# Patient Record
Sex: Female | Born: 1955 | Race: White | Hispanic: No | Marital: Married | Smoking: Former smoker
Health system: Southern US, Community
[De-identification: ages and names within clinical notes are randomized; demographics above are authoritative.]

## PROBLEM LIST (undated history)

## (undated) DIAGNOSIS — I499 Cardiac arrhythmia, unspecified: Secondary | ICD-10-CM

## (undated) DIAGNOSIS — I10 Essential (primary) hypertension: Secondary | ICD-10-CM

## (undated) DIAGNOSIS — R011 Cardiac murmur, unspecified: Secondary | ICD-10-CM

## (undated) HISTORY — PX: CHOLECYSTECTOMY: SHX55

## (undated) HISTORY — PX: LAPAROSCOPIC HYSTERECTOMY: SHX1926

## (undated) HISTORY — PX: COLONOSCOPY: SHX5424

---

## 2004-06-04 ENCOUNTER — Ambulatory Visit: Payer: Self-pay

## 2006-08-18 ENCOUNTER — Ambulatory Visit: Payer: Self-pay | Admitting: Obstetrics and Gynecology

## 2006-09-01 ENCOUNTER — Other Ambulatory Visit: Payer: Self-pay

## 2006-09-01 ENCOUNTER — Emergency Department: Payer: Self-pay | Admitting: Emergency Medicine

## 2006-09-10 ENCOUNTER — Ambulatory Visit: Payer: Self-pay | Admitting: Oncology

## 2006-09-21 LAB — CBC WITH DIFFERENTIAL (CANCER CENTER ONLY)
BASO#: 0.1 10*3/uL (ref 0.0–0.2)
LYMPH#: 1.8 10*3/uL (ref 0.9–3.3)
MCH: 32 pg (ref 26.0–34.0)
MCV: 94 fL (ref 81–101)
MONO#: 0.7 10*3/uL (ref 0.1–0.9)
WBC: 11.6 10*3/uL — ABNORMAL HIGH (ref 3.9–10.0)

## 2006-09-21 LAB — COMPREHENSIVE METABOLIC PANEL
AST: 20 U/L (ref 0–37)
Albumin: 4 g/dL (ref 3.5–5.2)
Glucose, Bld: 91 mg/dL (ref 70–99)
Potassium: 3.8 mEq/L (ref 3.5–5.3)

## 2006-09-21 LAB — LACTATE DEHYDROGENASE: LDH: 158 U/L (ref 94–250)

## 2006-09-23 ENCOUNTER — Ambulatory Visit (HOSPITAL_COMMUNITY): Admission: RE | Admit: 2006-09-23 | Discharge: 2006-09-23 | Payer: Self-pay | Admitting: Oncology

## 2006-10-07 LAB — COMPREHENSIVE METABOLIC PANEL
ALT: 27 U/L (ref 0–35)
AST: 21 U/L (ref 0–37)
Albumin: 4.3 g/dL (ref 3.5–5.2)
Alkaline Phosphatase: 44 U/L (ref 39–117)
BUN: 12 mg/dL (ref 6–23)
Calcium: 9.4 mg/dL (ref 8.4–10.5)
Chloride: 103 mEq/L (ref 96–112)
Potassium: 3.7 mEq/L (ref 3.5–5.3)
Sodium: 140 mEq/L (ref 135–145)

## 2006-10-07 LAB — CBC WITH DIFFERENTIAL (CANCER CENTER ONLY)
BASO%: 0.6 % (ref 0.0–2.0)
EOS%: 2.4 % (ref 0.0–7.0)
HCT: 43.3 % (ref 34.8–46.6)
LYMPH#: 1.9 10*3/uL (ref 0.9–3.3)
MCV: 93 fL (ref 81–101)
MONO%: 5 % (ref 0.0–13.0)
NEUT#: 4.4 10*3/uL (ref 1.5–6.5)
NEUT%: 64.5 % (ref 39.6–80.0)
RDW: 11.6 % (ref 10.5–14.6)
WBC: 6.8 10*3/uL (ref 3.9–10.0)

## 2006-10-07 LAB — LACTATE DEHYDROGENASE: LDH: 171 U/L (ref 94–250)

## 2006-12-20 ENCOUNTER — Ambulatory Visit: Payer: Self-pay | Admitting: Oncology

## 2006-12-21 LAB — CBC WITH DIFFERENTIAL (CANCER CENTER ONLY)
HGB: 13.6 g/dL (ref 11.6–15.9)
LYMPH#: 2.1 10*3/uL (ref 0.9–3.3)
MCH: 31.5 pg (ref 26.0–34.0)
MCHC: 34.1 g/dL (ref 32.0–36.0)
MCV: 92 fL (ref 81–101)
MONO#: 0.3 10*3/uL (ref 0.1–0.9)
NEUT#: 4.3 10*3/uL (ref 1.5–6.5)
NEUT%: 61.6 % (ref 39.6–80.0)
Platelets: 226 10*3/uL (ref 145–400)
RDW: 11.6 % (ref 10.5–14.6)
WBC: 6.9 10*3/uL (ref 3.9–10.0)

## 2006-12-21 LAB — BASIC METABOLIC PANEL
BUN: 14 mg/dL (ref 6–23)
CO2: 27 mEq/L (ref 19–32)
Calcium: 9.6 mg/dL (ref 8.4–10.5)
Chloride: 102 mEq/L (ref 96–112)
Creatinine, Ser: 0.69 mg/dL (ref 0.40–1.20)
Sodium: 139 mEq/L (ref 135–145)

## 2007-06-24 ENCOUNTER — Ambulatory Visit: Payer: Self-pay | Admitting: Oncology

## 2007-08-18 ENCOUNTER — Ambulatory Visit: Payer: Self-pay | Admitting: Oncology

## 2009-01-21 ENCOUNTER — Ambulatory Visit: Payer: Self-pay

## 2010-03-18 ENCOUNTER — Ambulatory Visit: Payer: Self-pay

## 2012-06-08 ENCOUNTER — Ambulatory Visit: Payer: Self-pay | Admitting: General Practice

## 2013-03-21 DIAGNOSIS — M25559 Pain in unspecified hip: Secondary | ICD-10-CM | POA: Insufficient documentation

## 2013-04-03 DIAGNOSIS — M545 Low back pain, unspecified: Secondary | ICD-10-CM | POA: Insufficient documentation

## 2013-04-04 ENCOUNTER — Ambulatory Visit: Payer: Self-pay

## 2013-04-19 ENCOUNTER — Ambulatory Visit: Payer: Self-pay

## 2013-09-08 DIAGNOSIS — Z0000 Encounter for general adult medical examination without abnormal findings: Secondary | ICD-10-CM | POA: Insufficient documentation

## 2013-09-08 DIAGNOSIS — E7800 Pure hypercholesterolemia, unspecified: Secondary | ICD-10-CM | POA: Insufficient documentation

## 2014-01-17 DIAGNOSIS — R2230 Localized swelling, mass and lump, unspecified upper limb: Secondary | ICD-10-CM | POA: Insufficient documentation

## 2014-01-17 DIAGNOSIS — M25519 Pain in unspecified shoulder: Secondary | ICD-10-CM | POA: Insufficient documentation

## 2014-01-26 ENCOUNTER — Emergency Department: Payer: Self-pay | Admitting: Emergency Medicine

## 2014-01-26 LAB — CBC
HCT: 39.6 % (ref 35.0–47.0)
HGB: 13.3 g/dL (ref 12.0–16.0)
MCH: 31.1 pg (ref 26.0–34.0)
MCHC: 33.5 g/dL (ref 32.0–36.0)
MCV: 93 fL (ref 80–100)
PLATELETS: 221 10*3/uL (ref 150–440)
RBC: 4.26 10*6/uL (ref 3.80–5.20)
RDW: 12.5 % (ref 11.5–14.5)
WBC: 5.8 10*3/uL (ref 3.6–11.0)

## 2014-01-26 LAB — BASIC METABOLIC PANEL
ANION GAP: 4 — AB (ref 7–16)
BUN: 15 mg/dL (ref 7–18)
CALCIUM: 9.1 mg/dL (ref 8.5–10.1)
CO2: 29 mmol/L (ref 21–32)
Chloride: 103 mmol/L (ref 98–107)
Creatinine: 0.54 mg/dL — ABNORMAL LOW (ref 0.60–1.30)
EGFR (African American): >60
Glucose: 102 mg/dL — ABNORMAL HIGH (ref 65–99)
OSMOLALITY: 273 (ref 275–301)
POTASSIUM: 3.5 mmol/L (ref 3.5–5.1)
SODIUM: 136 mmol/L (ref 136–145)

## 2014-01-26 LAB — TROPONIN I

## 2014-01-26 LAB — PRO B NATRIURETIC PEPTIDE: B-Type Natriuretic Peptide: 61 pg/mL (ref 0–125)

## 2014-02-02 DIAGNOSIS — M7550 Bursitis of unspecified shoulder: Secondary | ICD-10-CM | POA: Insufficient documentation

## 2014-02-02 DIAGNOSIS — M799 Soft tissue disorder, unspecified: Secondary | ICD-10-CM | POA: Insufficient documentation

## 2014-02-02 DIAGNOSIS — M7989 Other specified soft tissue disorders: Secondary | ICD-10-CM | POA: Insufficient documentation

## 2014-04-05 ENCOUNTER — Ambulatory Visit: Payer: Self-pay | Admitting: Family Medicine

## 2014-04-11 DIAGNOSIS — Z90710 Acquired absence of both cervix and uterus: Secondary | ICD-10-CM | POA: Insufficient documentation

## 2014-09-12 DIAGNOSIS — N952 Postmenopausal atrophic vaginitis: Secondary | ICD-10-CM | POA: Insufficient documentation

## 2015-03-12 DIAGNOSIS — K219 Gastro-esophageal reflux disease without esophagitis: Secondary | ICD-10-CM | POA: Insufficient documentation

## 2015-03-12 DIAGNOSIS — N930 Postcoital and contact bleeding: Secondary | ICD-10-CM | POA: Insufficient documentation

## 2015-04-01 DIAGNOSIS — R1013 Epigastric pain: Secondary | ICD-10-CM | POA: Insufficient documentation

## 2015-06-12 DIAGNOSIS — I10 Essential (primary) hypertension: Secondary | ICD-10-CM | POA: Insufficient documentation

## 2015-06-12 DIAGNOSIS — R140 Abdominal distension (gaseous): Secondary | ICD-10-CM | POA: Insufficient documentation

## 2015-08-28 DIAGNOSIS — IMO0001 Reserved for inherently not codable concepts without codable children: Secondary | ICD-10-CM | POA: Insufficient documentation

## 2015-10-14 ENCOUNTER — Ambulatory Visit: Payer: Self-pay | Admitting: Registered Nurse

## 2015-10-14 VITALS — BP 130/80 | HR 74 | Temp 98.5°F

## 2015-10-14 DIAGNOSIS — B349 Viral infection, unspecified: Secondary | ICD-10-CM

## 2015-10-14 DIAGNOSIS — H6593 Unspecified nonsuppurative otitis media, bilateral: Secondary | ICD-10-CM

## 2015-10-14 DIAGNOSIS — R300 Dysuria: Secondary | ICD-10-CM

## 2015-10-14 DIAGNOSIS — N3090 Cystitis, unspecified without hematuria: Secondary | ICD-10-CM

## 2015-10-14 DIAGNOSIS — J0110 Acute frontal sinusitis, unspecified: Secondary | ICD-10-CM

## 2015-10-14 LAB — POCT URINALYSIS DIPSTICK
Bilirubin, UA: NEGATIVE
GLUCOSE UA: NEGATIVE
KETONES UA: NEGATIVE
Leukocytes, UA: NEGATIVE
Nitrite, UA: NEGATIVE
Protein, UA: NEGATIVE
RBC UA: NEGATIVE
SPEC GRAV UA: 1.01
Urobilinogen, UA: 0.2
pH, UA: 6

## 2015-10-14 NOTE — Progress Notes (Signed)
Subjective:    Patient ID: Kristin Macias, female    DOB: 06-22-1956, 60 y.o.   MRN: [id number]  HPI Comments: Married caucasian female here for evaluation of urinary frequency and burning since 11 Oct 2015 not improving with increased po fluids.  Denied PMHx kidney stones; had diarrhea x 7 yesterday none today didn't eat breakfast this am concerned would need fasting labs for appt; denied changes in chronic back pain; nausea x 2 days; daily headache x 6 months; exposed to others with illness at work  PMHx heartburn had to stop Rx medications due to cost prn tums now + belching and epigastric discomfort, flatulence; PMHx atrophic vaginitis stopped hormones and effexor has noticed increased frequency of vaginal bleeding after intercourse; denied new sexual partners  Urinary Tract Infection  This is a new problem. The current episode started in the past 7 days. The problem occurs every urination. The problem has been unchanged. The quality of the pain is described as burning. The pain is at a severity of 4/10. The pain is mild. There has been no fever. She is sexually active. There is no history of pyelonephritis. Associated symptoms include frequency and nausea. Pertinent negatives include no chills, discharge, flank pain, hematuria, hesitancy, possible pregnancy, sweats, urgency or vomiting. She has tried increased fluids for the symptoms. The treatment provided no relief. There is no history of catheterization, kidney stones, recurrent UTIs, a single kidney, urinary stasis or a urological procedure.      Review of Systems  Constitutional: Negative for fever, chills, diaphoresis, activity change, appetite change, fatigue and unexpected weight change.  HENT: Negative for congestion, dental problem, drooling, ear discharge, ear pain, facial swelling, hearing loss, mouth sores, nosebleeds, postnasal drip, rhinorrhea, sinus pressure, sneezing, sore throat, tinnitus, trouble swallowing and  voice change.   Eyes: Negative for photophobia, pain, discharge, redness, itching and visual disturbance.  Respiratory: Negative for cough, choking, chest tightness, shortness of breath, wheezing and stridor.   Cardiovascular: Negative for chest pain, palpitations and leg swelling.  Gastrointestinal: Positive for nausea, abdominal pain and diarrhea. Negative for vomiting, constipation, blood in stool and abdominal distention.  Endocrine: Negative for cold intolerance and heat intolerance.  Genitourinary: Positive for frequency. Negative for dysuria, hesitancy, urgency, hematuria, flank pain and difficulty urinating.  Musculoskeletal: Negative for myalgias, back pain, joint swelling, arthralgias, gait problem, neck pain and neck stiffness.  Skin: Negative for color change, pallor, rash and wound.  Allergic/Immunologic: Positive for environmental allergies. Negative for food allergies.  Neurological: Positive for headaches. Negative for dizziness, tremors, seizures, syncope, facial asymmetry, speech difficulty, weakness, light-headedness and numbness.  Hematological: Negative for adenopathy. Does not bruise/bleed easily.  Psychiatric/Behavioral: Negative for behavioral problems, confusion, sleep disturbance and agitation.       Objective:   Physical Exam  Constitutional: She is oriented to person, place, and time. She appears well-developed and well-nourished. She is active and cooperative.  Non-toxic appearance. She does not have a sickly appearance. She appears ill. No distress.  HENT:  Head: Normocephalic and atraumatic.  Right Ear: Hearing, external ear and ear canal normal. A middle ear effusion is present.  Left Ear: Hearing, external ear and ear canal normal. A middle ear effusion is present.  Nose: Mucosal edema and rhinorrhea present. No nose lacerations, sinus tenderness, nasal deformity, septal deviation or nasal septal hematoma. No epistaxis.  No foreign bodies. Right sinus exhibits  frontal sinus tenderness. Right sinus exhibits no maxillary sinus tenderness. Left sinus exhibits frontal sinus tenderness. Left sinus  exhibits no maxillary sinus tenderness.  Mouth/Throat: Uvula is midline and mucous membranes are normal. Mucous membranes are not pale, not dry and not cyanotic. She does not have dentures. No oral lesions. No trismus in the jaw. Normal dentition. No dental abscesses, uvula swelling, lacerations or dental caries. Posterior oropharyngeal edema and posterior oropharyngeal erythema present. No oropharyngeal exudate or tonsillar abscesses.  Cobblestoning posterior pharynx; bilateral TMs with air fluid level clear; bilateral nasal turbinates with edema/erythema/clear discharge  Eyes: Conjunctivae, EOM and lids are normal. Pupils are equal, round, and reactive to light. Right eye exhibits no chemosis, no discharge, no exudate and no hordeolum. No foreign body present in the right eye. Left eye exhibits no chemosis, no discharge, no exudate and no hordeolum. No foreign body present in the left eye. Right conjunctiva is not injected. Right conjunctiva has no hemorrhage. Left conjunctiva is not injected. Left conjunctiva has no hemorrhage. No scleral icterus. Right eye exhibits normal extraocular motion and no nystagmus. Left eye exhibits normal extraocular motion and no nystagmus. Right pupil is round and reactive. Left pupil is round and reactive. Pupils are equal.  Neck: Trachea normal and normal range of motion. Neck supple. No tracheal tenderness, no spinous process tenderness and no muscular tenderness present. No rigidity. No tracheal deviation, no edema, no erythema and normal range of motion present. No thyroid mass and no thyromegaly present.  Cardiovascular: Normal rate, regular rhythm, S1 normal, S2 normal, normal heart sounds and intact distal pulses.  PMI is not displaced.  Exam reveals no gallop and no friction rub.   No murmur heard. Pulmonary/Chest: Effort normal and  breath sounds normal. No accessory muscle usage or stridor. No respiratory distress. She has no decreased breath sounds. She has no wheezes. She has no rhonchi. She has no rales. She exhibits no tenderness.  Abdominal: Soft. Normal appearance. She exhibits no shifting dullness, no distension, no pulsatile liver, no fluid wave, no abdominal bruit, no ascites, no pulsatile midline mass and no mass. Bowel sounds are decreased. There is no hepatosplenomegaly. There is tenderness in the epigastric area and suprapubic area. There is no rigidity, no rebound, no guarding, no CVA tenderness, no tenderness at McBurney's point and negative Murphy's sign.    tympanny to percussion x 4 quads; hypoactive bowel sounds x 4 quads  Musculoskeletal: Normal range of motion. She exhibits no edema or tenderness.       Right shoulder: Normal.       Left shoulder: Normal.       Right hip: Normal.       Left hip: Normal.       Right knee: Normal.       Left knee: Normal.       Cervical back: Normal.       Right hand: Normal.       Left hand: Normal.  Lymphadenopathy:       Head (right side): No submental, no submandibular, no tonsillar, no preauricular, no posterior auricular and no occipital adenopathy present.       Head (left side): No submental, no submandibular, no tonsillar, no preauricular, no posterior auricular and no occipital adenopathy present.    She has no cervical adenopathy.       Right cervical: No superficial cervical, no deep cervical and no posterior cervical adenopathy present.      Left cervical: No superficial cervical, no deep cervical and no posterior cervical adenopathy present.  Neurological: She is alert and oriented to person, place, and time. She  has normal strength. She is not disoriented. She displays no atrophy and no tremor. No cranial nerve deficit or sensory deficit. She exhibits normal muscle tone. She displays no seizure activity. Coordination and gait normal. GCS eye subscore is 4.  GCS verbal subscore is 5. GCS motor subscore is 6.  Skin: Skin is warm, dry and intact. No abrasion, no bruising, no burn, no ecchymosis, no laceration, no lesion, no petechiae and no rash noted. She is not diaphoretic. No cyanosis or erythema. No pallor. Nails show no clubbing.  Psychiatric: She has a normal mood and affect. Her speech is normal and behavior is normal. Judgment and thought content normal. Cognition and memory are normal.  Nursing note and vitals reviewed.    1115 discussed urinalysis results with patient normal negative blood, ketones, bacteria, protein patient verbalized understanding of information and had no further questions at this time.     Assessment & Plan:  A-cystitis acute, viral illness; bilateral otitis media effusion; sinusitis acute frontal. P-Irritation may be due to frequent wiping and mild dehydration due to diarrhea over weekend.  Also with atrophic vaginitis.  Discussed with patient to schedule GYN appt as worsening of symptoms for re-evaluation and consider other treatments than topical hormones and effexor which she dc'd.  Patient is also to push fluids and may use Pyridium otc prn x 2 days as needed.  Patient refused Rx for pyridium  Hydrate, avoid dehydration.  Avoid holding urine void on frequent basis every 4 to 6 hours.  If unable to void every 8 hours follow up for re-evaluation with PCM, urgent care or ER.   Call or return to clinic as needed if these symptoms worsen or fail to improve as anticipated.  Patient verbalized agreement and understanding of treatment plan and had no further questions at this time. P2:  Hydrate and cranberry juice  Recommended to patient stay at home if diarrhea until resolved x 24 hours.  Heartburn prn tums OTC 1-2 q6h or zantac 150mg  po BID.  Refused work note  I have recommended clear fluids and bland diet.  Avoid dairy/spicy, fried and large portions of meat while having nausea.  If vomiting hold po intake x 1 hour.  Then  sips clear fluids like broths, ginger ale, power ade, gatorade, pedialyte may advance to soft/bland if no vomiting x 24 hours and appetite returned otherwise hydration main focus.     Return to the clinic if symptoms persist or worsen; I have alerted the patient to call if high fever, dehydration, marked weakness, fainting, increased abdominal pain, blood in stool or vomit (red or black).   Patient verbalized agreement and understanding of treatment plan and had no further questions at this time.  Supportive treatment.   No evidence of invasive bacterial infection, non toxic and well hydrated.  This is most likely self limiting viral infection.  I do not see where any further testing or imaging is necessary at this time.   I will suggest supportive care, rest, good hygiene and encourage the patient to take adequate fluids.  The patient is to return to clinic or EMERGENCY ROOM if symptoms worsen or change significantly e.g. ear pain, fever, purulent discharge from ears or bleeding.  Patient verbalized agreement and understanding of treatment plan.      Trial flonase 1 spray each nostril BID, nasal saline 2 sprays each nostril q2h prn congestion.  Consider antihistamine OTC po daily.  No evidence of systemic bacterial infection, non toxic and well hydrated.  I do not see where any further testing or imaging is necessary at this time.   I will suggest supportive care, rest, good hygiene and encourage the patient to take adequate fluids.  The patient is to return to clinic or EMERGENCY ROOM if symptoms worsen or change significantly.  Patient verbalized agreement and understanding of treatment plan and had no further questions at this time.   P2:  Hand washing and cover cough

## 2016-01-20 ENCOUNTER — Encounter: Payer: Self-pay | Admitting: Physician Assistant

## 2016-01-20 ENCOUNTER — Ambulatory Visit: Payer: Self-pay | Admitting: Physician Assistant

## 2016-01-20 VITALS — BP 100/70 | HR 69 | Temp 99.1°F

## 2016-01-20 DIAGNOSIS — R509 Fever, unspecified: Secondary | ICD-10-CM

## 2016-01-20 MED ORDER — DOXYCYCLINE HYCLATE 100 MG PO TABS: 100.0000 mg | ORAL_TABLET | Freq: Two times a day (BID) | ORAL | Status: DC

## 2016-01-20 NOTE — Progress Notes (Signed)
S: C/o runny nose, sore throat, headache, and  congestion for 3 days, + fever, chills, no cp/sob, v/d; mucus is clear throughout the day, cough is sporadic, ?tick bite, has been outside quite a bit but hasn't seen a tick  Using otc meds: robitussin  O: PE: vitals wnl, nperrl eomi, normocephalic, tms dull, nasal mucosa red and swollen, throat wnl, , neck supple no lymph, lungs c t a, cv rrr, neuro intact  A:  Acute viral uri, fever with ?tick exposure   P: drink fluids, continue regular meds , use otc meds of choice, return if not improving in 5 days, return earlier if worsening , doxy 100mg  bid

## 2016-05-29 ENCOUNTER — Ambulatory Visit: Payer: Self-pay | Admitting: Physician Assistant

## 2016-05-29 ENCOUNTER — Encounter: Payer: Self-pay | Admitting: Physician Assistant

## 2016-05-29 VITALS — BP 126/60 | HR 66 | Temp 98.5°F

## 2016-05-29 DIAGNOSIS — R42 Dizziness and giddiness: Secondary | ICD-10-CM

## 2016-05-29 MED ORDER — MECLIZINE HCL 25 MG PO TABS: 25.0000 mg | ORAL_TABLET | Freq: Three times a day (TID) | ORAL | 0 refills | Status: DC | PRN

## 2016-05-29 NOTE — Progress Notes (Signed)
S: c/o dizziness with head movement, started this morning when she got out of bed, no cp/sob, no v/d, hx of same over a year ago  O: vitals wnl, nad, perrl eomi, pt states eomi make her feel dizzy, no nystagmus noted, tms clear, nasal mucosa boggy, throat wnl , neck supple no lymph, lungs c t a, cv rrr, no bruits at carotids  A: acute vertigo  P: antivert 25mg  tid

## 2017-01-29 ENCOUNTER — Encounter: Payer: Self-pay | Admitting: Physician Assistant

## 2017-01-29 ENCOUNTER — Ambulatory Visit: Payer: Self-pay | Admitting: Physician Assistant

## 2017-01-29 VITALS — BP 121/60 | HR 70 | Temp 98.6°F

## 2017-01-29 DIAGNOSIS — W57XXXA Bitten or stung by nonvenomous insect and other nonvenomous arthropods, initial encounter: Secondary | ICD-10-CM

## 2017-01-29 MED ORDER — METHYLPREDNISOLONE 4 MG PO TBPK: ORAL_TABLET | ORAL | 0 refills | Status: DC

## 2017-01-29 MED ORDER — SULFAMETHOXAZOLE-TRIMETHOPRIM 800-160 MG PO TABS: 1.0000 | ORAL_TABLET | Freq: Two times a day (BID) | ORAL | 0 refills | Status: DC

## 2017-01-29 MED ORDER — DEXAMETHASONE SODIUM PHOSPHATE 10 MG/ML IJ SOLN
10.0000 mg | Freq: Once | INTRAMUSCULAR | Status: AC
  Administered 2017-01-29: 10 mg via INTRAMUSCULAR

## 2017-01-29 NOTE — Progress Notes (Signed)
S: c/o red swollen area on her stomach area, was on a float in her pool last night, when she got up she felt something sting her, put etoh on it, this morning when she got up the entire area is red and warm, denies fever/chills, no drainage from the site  O: vitals wnl, nad, skin with large area on abd that is pink warm and a little tender, no drainage noted, no visible bite marks, n/v intact  A: insect bite  P: decadron 10mg  IM, medrol dose pack, septra

## 2017-02-03 DIAGNOSIS — F1029 Alcohol dependence with unspecified alcohol-induced disorder: Secondary | ICD-10-CM | POA: Insufficient documentation

## 2017-04-02 ENCOUNTER — Ambulatory Visit: Payer: Self-pay | Admitting: Physician Assistant

## 2017-04-02 ENCOUNTER — Encounter: Payer: Self-pay | Admitting: Physician Assistant

## 2017-04-02 VITALS — BP 124/66 | HR 69 | Temp 98.9°F | Resp 16

## 2017-04-02 DIAGNOSIS — R5383 Other fatigue: Secondary | ICD-10-CM

## 2017-04-02 NOTE — Progress Notes (Signed)
S: states she is really tired and fatigued all of the time, isn't taking her antidepressant anymore, states she is controlling her drinking but is not taking naltrexone; having more hot flashes at night since coming off of hormone pills, denies cp/sob/v/d/fever/chills  O: vitals wnl, nad, lungs c t a, cv rrr, abd soft nontender bs normal, n/v intact  A: fatigue  P: labs, f/u with pcp

## 2017-04-05 LAB — CMP12+LP+TP+TSH+6AC+CBC/D/PLT
ALBUMIN: 4.3 g/dL (ref 3.6–4.8)
ALT: 14 IU/L (ref 0–32)
AST: 18 IU/L (ref 0–40)
Albumin/Globulin Ratio: 2 (ref 1.2–2.2)
Alkaline Phosphatase: 58 IU/L (ref 39–117)
BUN/Creatinine Ratio: 29 — ABNORMAL HIGH (ref 12–28)
BUN: 20 mg/dL (ref 8–27)
Basophils Absolute: 0 10*3/uL (ref 0.0–0.2)
Basos: 0 %
Bilirubin Total: 0.4 mg/dL (ref 0.0–1.2)
CHLORIDE: 103 mmol/L (ref 96–106)
CHOLESTEROL TOTAL: 223 mg/dL — AB (ref 100–199)
CREATININE: 0.7 mg/dL (ref 0.57–1.00)
Calcium: 9.6 mg/dL (ref 8.7–10.3)
Chol/HDL Ratio: 2.9 ratio (ref 0.0–4.4)
EOS (ABSOLUTE): 0.2 10*3/uL (ref 0.0–0.4)
EOS: 3 %
Free Thyroxine Index: 2.2 (ref 1.2–4.9)
GFR calc Af Amer: 109 mL/min/{1.73_m2} (ref >59)
GFR, EST NON AFRICAN AMERICAN: 94 mL/min/{1.73_m2} (ref >59)
GGT: 18 IU/L (ref 0–60)
GLUCOSE: 96 mg/dL (ref 65–99)
Globulin, Total: 2.1 g/dL (ref 1.5–4.5)
HDL: 78 mg/dL (ref >39)
HEMOGLOBIN: 12.8 g/dL (ref 11.1–15.9)
Hematocrit: 38 % (ref 34.0–46.6)
IMMATURE GRANS (ABS): 0 10*3/uL (ref 0.0–0.1)
IRON: 110 ug/dL (ref 27–159)
Immature Granulocytes: 0 %
LDH: 210 IU/L (ref 119–226)
LDL Calculated: 129 mg/dL — ABNORMAL HIGH (ref 0–99)
LYMPHS ABS: 2 10*3/uL (ref 0.7–3.1)
Lymphs: 37 %
MCH: 30.5 pg (ref 26.6–33.0)
MCHC: 33.7 g/dL (ref 31.5–35.7)
MCV: 91 fL (ref 79–97)
MONOS ABS: 0.4 10*3/uL (ref 0.1–0.9)
Monocytes: 7 %
Neutrophils Absolute: 2.8 10*3/uL (ref 1.4–7.0)
Neutrophils: 53 %
PHOSPHORUS: 3.8 mg/dL (ref 2.5–4.5)
PLATELETS: 248 10*3/uL (ref 150–379)
POTASSIUM: 4.5 mmol/L (ref 3.5–5.2)
RBC: 4.2 x10E6/uL (ref 3.77–5.28)
RDW: 13.3 % (ref 12.3–15.4)
SODIUM: 143 mmol/L (ref 134–144)
T3 UPTAKE RATIO: 32 % (ref 24–39)
T4 TOTAL: 6.8 ug/dL (ref 4.5–12.0)
TOTAL PROTEIN: 6.4 g/dL (ref 6.0–8.5)
TSH: 1.43 u[IU]/mL (ref 0.450–4.500)
Triglycerides: 81 mg/dL (ref 0–149)
URIC ACID: 5 mg/dL (ref 2.5–7.1)
VLDL Cholesterol Cal: 16 mg/dL (ref 5–40)
WBC: 5.4 10*3/uL (ref 3.4–10.8)

## 2017-04-05 LAB — EPSTEIN-BARR VIRUS VCA ANTIBODY PANEL
EBV Early Antigen Ab, IgG: <9 U/mL (ref 0.0–8.9)
EBV NA IGG: 155 U/mL — AB (ref 0.0–17.9)
EBV VCA IgG: >600 U/mL — ABNORMAL HIGH (ref 0.0–17.9)
EBV VCA IgM: 79.9 U/mL — ABNORMAL HIGH (ref 0.0–35.9)

## 2017-04-05 LAB — VITAMIN D 25 HYDROXY (VIT D DEFICIENCY, FRACTURES): VIT D 25 HYDROXY: 43.5 ng/mL (ref 30.0–100.0)

## 2017-04-05 LAB — B12 AND FOLATE PANEL
FOLATE: 16.5 ng/mL (ref >3.0)
VITAMIN B 12: 1887 pg/mL — AB (ref 232–1245)

## 2017-05-17 ENCOUNTER — Encounter: Payer: Self-pay | Admitting: Physician Assistant

## 2017-05-17 ENCOUNTER — Ambulatory Visit: Payer: Self-pay | Admitting: Physician Assistant

## 2017-05-17 VITALS — BP 140/65 | HR 69 | Temp 99.0°F | Resp 16

## 2017-05-17 DIAGNOSIS — W57XXXA Bitten or stung by nonvenomous insect and other nonvenomous arthropods, initial encounter: Secondary | ICD-10-CM

## 2017-05-17 MED ORDER — DEXAMETHASONE SODIUM PHOSPHATE 10 MG/ML IJ SOLN
10.0000 mg | Freq: Once | INTRAMUSCULAR | Status: AC
  Administered 2017-05-17: 10 mg via INTRAMUSCULAR

## 2017-05-17 MED ORDER — METHYLPREDNISOLONE 4 MG PO TBPK
ORAL_TABLET | ORAL | 0 refills | Status: DC
Start: 2017-05-17 — End: 2017-07-07

## 2017-05-17 MED ORDER — CEPHALEXIN 500 MG PO CAPS: 500.0000 mg | ORAL_CAPSULE | Freq: Three times a day (TID) | ORAL | 0 refills | Status: DC

## 2017-05-17 NOTE — Progress Notes (Signed)
S: states she went to work in the yard, put her gloves on and felt a sting on her hand, didn't see the bug, but now there is a red dot and her fingers and hand are swollen, has a red streak going up her arm, barely got her rings off of her fingers, no cp/sob, no swelling of mouth or lips, used ice, sx for 2 days  O: vitals wnl nad, skin with swollen red left hand and fingers, streak on volar aspect goes to mid forearm, pt is able to move fingers and wrist without difficulty, n/v intact, lungs c t a, cv rrr  A: insect bite, allergic reaction /cellulitis  P: keflex  tid x 7d, decadron  im given in clinic, medrol dose pack to be started tomorrow, elevated and ice, pt to return if worsening or not better in 2 days

## 2017-07-07 ENCOUNTER — Ambulatory Visit: Payer: Self-pay | Admitting: Emergency Medicine

## 2017-07-07 VITALS — BP 130/70 | HR 81 | Temp 98.5°F | Resp 16

## 2017-07-07 DIAGNOSIS — K2950 Unspecified chronic gastritis without bleeding: Secondary | ICD-10-CM

## 2017-07-07 MED ORDER — PANTOPRAZOLE SODIUM 20 MG PO TBEC: 20.0000 mg | DELAYED_RELEASE_TABLET | Freq: Every day | ORAL | 1 refills | Status: DC

## 2017-07-07 NOTE — Progress Notes (Signed)
S:  Is here with complaint of generalized  focused mainly on the right and left upper quadrants. Patient states that she has been taking Prilosec for approximately one year for gastritis and some reflux disease. Her  reflux got worse the day after Thanksgiving.  She had her gallbladder removed several years ago. She denies any shortness or chest pain.  She denies any episodes of diaphoresis or radiation of her abdominal pain. O:  Lungs are clear bilaterally. Heart regular rate and rhythm without murmur.  Abdomen soft, nontender. Bowel sounds normoactive 4 quadrants.   A:  Gastritis with reflux P:  Patient was placed on protonix  20 mg once a day for 30 days with one refill.  If not improving will consider GI

## 2017-07-26 ENCOUNTER — Ambulatory Visit: Payer: Self-pay | Admitting: Emergency Medicine

## 2017-07-26 VITALS — BP 130/79 | HR 70 | Temp 98.5°F | Resp 16

## 2017-07-26 DIAGNOSIS — K2950 Unspecified chronic gastritis without bleeding: Secondary | ICD-10-CM

## 2017-07-26 NOTE — Progress Notes (Signed)
S: Is here today with continued abdominal discomfort.  Patient states that despite watching what she is eating she continues to have reflux and gastritis.  She has continued to take Protonix 20 mg daily and is noticed only minimal improvement.  She denies any fever or chills.  No change in bowel habits.  She is status post cholecystectomy O: Abdomen soft no point tenderness noted.  Bowel sounds normoactive x4 quadrants. A: Persistent gastritis P: Patient will continue taking Protonix.  An appointment with the gastroenterologist will be made for further evaluation.

## 2017-07-28 NOTE — Addendum Note (Signed)
Addended by: Catha BrowEACON, Mandeep Kiser T on: 07/28/2017 03:10 PM   Modules accepted: Orders

## 2017-08-18 ENCOUNTER — Other Ambulatory Visit: Payer: Self-pay

## 2017-08-18 ENCOUNTER — Encounter: Payer: Self-pay | Admitting: Gastroenterology

## 2017-08-18 ENCOUNTER — Encounter (INDEPENDENT_AMBULATORY_CARE_PROVIDER_SITE_OTHER): Payer: Self-pay

## 2017-08-18 ENCOUNTER — Ambulatory Visit (INDEPENDENT_AMBULATORY_CARE_PROVIDER_SITE_OTHER): Payer: Managed Care, Other (non HMO) | Admitting: Gastroenterology

## 2017-08-18 VITALS — BP 154/81 | HR 76 | Temp 98.3°F | Ht 64.0 in | Wt 143.0 lb

## 2017-08-18 DIAGNOSIS — R1013 Epigastric pain: Secondary | ICD-10-CM | POA: Diagnosis not present

## 2017-08-18 DIAGNOSIS — Z1211 Encounter for screening for malignant neoplasm of colon: Secondary | ICD-10-CM | POA: Diagnosis not present

## 2017-08-18 NOTE — Addendum Note (Signed)
Addended by: Ardyth ManARTER, Kaydee Magel Z on: 08/18/2017 03:26 PM   Modules accepted: Orders, SmartSet

## 2017-08-18 NOTE — Progress Notes (Signed)
Kristin Mood MD, MRCP(U.K) [address]  [address]  [place], [place] [address]  Main: [phone]  Fax: [phone]   Gastroenterology Consultation  Referring Provider:     No ref. provider found Primary Care Physician:  Kristin Plunk, MD Primary Gastroenterologist:  Dr. Wyline Macias  Reason for Consultation:     Abdominal pain         HPI:   Kristin Macias is a 62 y.o. y/o female referred for consultation & management  by Dr. Ralene Macias, Kristin Basque, MD.    She has been referred for gastritis. Was commenced on Protonix on 07/26/17 and referred to GI .    Abdominal pain: Onset: She says that she has issues with Reflux for about a year. Has had pain on and off for a year, recently worse. Occurs once every other day , Each episode can last all day . Does wake her up at night  Site :Center of the abdomen  Radiation: sometimes in her throat with burning  Severity ::hurts Nature of pain: squeeze Aggravating factors: unsure  Relieving factors :goes off on its own  Weight loss: no  NSAID use: none  PPI use :yes - Pantoprazole 20 mg- helped initially but stopped. Takes it in the morning with some tea, is also on omeprazole 40 mg  Gall bladder surgery: taken out  Frequency of bowel movements: every day , not hard  Change in bowel movements: no  Relief with bowel movements: it has , thinks pain may be a bit worse when she has hard stools  Gas/Bloating/Abdominal distension: yes   EGD in 2016 - Salmon colored mucosa was seen  , colonoscopy was last in 2009 . She has had polyps  Consumes a lot of artificial sugars- 2 packs of artificial sugars a day , lot of diet soda and unsweetened tea no artificial sugars.   Prior to Admission medications   Medication Sig Start Date End Date Taking? Authorizing Provider  aspirin 81 MG chewable tablet Chew by mouth. 04/06/11   [provider]  cephALEXin (KEFLEX) 500 MG capsule Take 500 mg by mouth 3 (three) times  daily. 05/17/17   [provider]  gabapentin (NEURONTIN) 300 MG capsule Take 300 mg by mouth at bedtime. 06/24/17   [provider]  hydrochlorothiazide (HYDRODIURIL) 25 MG tablet Take 25 mg by mouth daily. 09/16/15   [provider]  ibuprofen (ADVIL,MOTRIN) 200 MG tablet Take by mouth.    [provider]  lisinopril (PRINIVIL,ZESTRIL) 20 MG tablet Take 20 mg by mouth. 09/17/15   [provider]  naltrexone (DEPADE) 50 MG tablet Take 50 mg by mouth daily. 03/16/17   [provider]  omeprazole (PRILOSEC) 40 MG capsule Take 40 mg by mouth. 05/27/17 05/27/18  [provider]  pantoprazole (PROTONIX) 20 MG tablet Take 1 tablet (20 mg total) by mouth daily. 07/07/17   Kristin Rumps, PA-C  sertraline (ZOLOFT) 50 MG tablet Take 50 mg by mouth daily. 06/24/17   [provider]  traZODone (DESYREL) 50 MG tablet Take 50 mg by mouth at bedtime. 06/24/17   [provider]  vitamin B-12 (CYANOCOBALAMIN) 1000 MCG tablet Take 1 tablet by mouth daily.    [provider]  vitamin E 1000 UNIT capsule Take by mouth.    [provider]    No family history on file.   Social History   Tobacco Use  . Smoking status: Former Games developer  . Smokeless tobacco: Never Used  Substance Use Topics  . Alcohol use: Not on file  . Drug use: Not on file    Allergies as of 08/18/2017 - Review Complete 07/26/2017  Allergen Reaction Noted  . Amlodipine  10/14/2015  . Tetracycline  10/14/2015    Review of Systems:    All systems reviewed and negative except where noted in HPI.   Physical Exam:  There were no vitals taken for this visit. No LMP recorded. Psych:  Alert and cooperative. Normal Macias and affect. General:   Alert,  Well-developed, well-nourished, pleasant and cooperative in NAD Head:  Normocephalic and atraumatic. Eyes:  Sclera clear, no icterus.   Conjunctiva pink. Ears:  Normal auditory acuity. Nose:  No  deformity, discharge, or lesions. Mouth:  No deformity or lesions,oropharynx pink & moist. Neck:  Supple; no masses or thyromegaly. Lungs:  Respirations even and unlabored.  Clear throughout to auscultation.   No wheezes, crackles, or rhonchi. No acute distress. Heart:  Regular rate and rhythm; no murmurs, clicks, rubs, or gallops. Abdomen:  Normal bowel sounds.  No bruits.  Soft, non-tender and non-distended without masses, hepatosplenomegaly or hernias noted.  No guarding or rebound tenderness.    Extremities:  No clubbing or edema.  No cyanosis. Neurologic:  Alert and oriented x3;  grossly normal neurologically. Skin:  Intact without significant lesions or rashes. No jaundice. Lymph Nodes:  No significant cervical adenopathy. Psych:  Alert and cooperative. Normal Macias and affect.  Imaging Studies: No results found.  Assessment and Plan:   Kristin Macias is a 62 y.o. y/o female has been referred for abdominal pain/dyspepsia. May have an element of pain from gas due to artificial sugars/constipation.   1. EGD to evaluate dyspepsia not responding to PPI 2. Screening colonoscopy  3. Stool pylori antigen  4. At next visit if not feeling any better will consider CT scan of the abdomen  5. Stop al artificial sugars for 2 weeks, can use regular sugar. If issues with gas feel better then can restart as tolerated.  6. Miralax PRN for constipation   I have discussed alternative options, risks & benefits,  which include, but are not limited to, bleeding, infection, perforation,respiratory complication & drug reaction.  The patient agrees with this plan & written consent will be obtained.     Follow up in 8 weeks   Dr Kristin MoodKiran Damita Eppard MD,MRCP(U.K)

## 2017-08-18 NOTE — Patient Instructions (Signed)
Mrs. Kristin Macias, Thank you for allowing us to help care for your medical needs. During today's visit Dr. Tobi BastosAnna has advised the following:  1. EGD to evaluate dyspepsia not responding to PPI 2. Screening colonoscopy  3. Stool pylori antigen  4. At next visit if not feeling any better will consider CT scan of the abdomen  5. Stop al artificial sugars for 2 weeks, can use regular sugar. If issues with gas feel better then can restart as tolerated.  6. Miralax PRN for constipation    Please stop by the desk to schedule your follow up appt.

## 2017-08-23 ENCOUNTER — Other Ambulatory Visit
Admission: RE | Admit: 2017-08-23 | Discharge: 2017-08-23 | Disposition: A | Discharge status: Home / Self Care | Payer: Managed Care, Other (non HMO) | Source: Ambulatory Visit | Attending: Gastroenterology | Admitting: Gastroenterology

## 2017-08-23 DIAGNOSIS — R1013 Epigastric pain: Secondary | ICD-10-CM | POA: Diagnosis not present

## 2017-08-25 ENCOUNTER — Ambulatory Visit: Payer: Managed Care, Other (non HMO) | Admitting: Anesthesiology

## 2017-08-25 ENCOUNTER — Encounter: Payer: Self-pay | Admitting: Gastroenterology

## 2017-08-25 ENCOUNTER — Encounter
Admission: RE | Disposition: A | Discharge status: Home / Self Care | Payer: Self-pay | Source: Ambulatory Visit | Attending: Gastroenterology

## 2017-08-25 ENCOUNTER — Ambulatory Visit
Admission: RE | Admit: 2017-08-25 | Discharge: 2017-08-25 | Disposition: A | Discharge status: Home / Self Care | Payer: Managed Care, Other (non HMO) | Source: Ambulatory Visit | Attending: Gastroenterology | Admitting: Gastroenterology

## 2017-08-25 ENCOUNTER — Encounter: Payer: Self-pay | Admitting: *Deleted

## 2017-08-25 DIAGNOSIS — Z881 Allergy status to other antibiotic agents status: Secondary | ICD-10-CM | POA: Diagnosis not present

## 2017-08-25 DIAGNOSIS — Z8719 Personal history of other diseases of the digestive system: Secondary | ICD-10-CM | POA: Insufficient documentation

## 2017-08-25 DIAGNOSIS — I10 Essential (primary) hypertension: Secondary | ICD-10-CM | POA: Diagnosis not present

## 2017-08-25 DIAGNOSIS — Z09 Encounter for follow-up examination after completed treatment for conditions other than malignant neoplasm: Secondary | ICD-10-CM | POA: Insufficient documentation

## 2017-08-25 DIAGNOSIS — Z79899 Other long term (current) drug therapy: Secondary | ICD-10-CM | POA: Diagnosis not present

## 2017-08-25 DIAGNOSIS — Z87891 Personal history of nicotine dependence: Secondary | ICD-10-CM | POA: Insufficient documentation

## 2017-08-25 DIAGNOSIS — Z888 Allergy status to other drugs, medicaments and biological substances status: Secondary | ICD-10-CM | POA: Diagnosis not present

## 2017-08-25 DIAGNOSIS — R1013 Epigastric pain: Secondary | ICD-10-CM | POA: Diagnosis not present

## 2017-08-25 DIAGNOSIS — K5730 Diverticulosis of large intestine without perforation or abscess without bleeding: Secondary | ICD-10-CM | POA: Diagnosis not present

## 2017-08-25 DIAGNOSIS — K219 Gastro-esophageal reflux disease without esophagitis: Secondary | ICD-10-CM | POA: Diagnosis not present

## 2017-08-25 DIAGNOSIS — Z7982 Long term (current) use of aspirin: Secondary | ICD-10-CM | POA: Insufficient documentation

## 2017-08-25 DIAGNOSIS — Z86010 Personal history of colonic polyps: Secondary | ICD-10-CM | POA: Diagnosis not present

## 2017-08-25 DIAGNOSIS — Z1211 Encounter for screening for malignant neoplasm of colon: Secondary | ICD-10-CM

## 2017-08-25 HISTORY — DX: Cardiac murmur, unspecified: R01.1

## 2017-08-25 HISTORY — DX: Cardiac arrhythmia, unspecified: I49.9

## 2017-08-25 HISTORY — PX: ESOPHAGOGASTRODUODENOSCOPY (EGD) WITH PROPOFOL: SHX5813

## 2017-08-25 HISTORY — DX: Essential (primary) hypertension: I10

## 2017-08-25 HISTORY — PX: COLONOSCOPY WITH PROPOFOL: SHX5780

## 2017-08-25 LAB — H. PYLORI ANTIGEN, STOOL: H. PYLORI STOOL AG, EIA: NEGATIVE

## 2017-08-25 SURGERY — ESOPHAGOGASTRODUODENOSCOPY (EGD) WITH PROPOFOL
Anesthesia: General

## 2017-08-25 MED ORDER — LIDOCAINE HCL (PF) 1 % IJ SOLN
INTRAMUSCULAR | Status: AC
  Administered 2017-08-25: 0.3 mL via INTRADERMAL
  Filled 2017-08-25: qty 2

## 2017-08-25 MED ORDER — SODIUM CHLORIDE 0.9 % IV SOLN
INTRAVENOUS | Status: DC
  Administered 2017-08-25: 12:00:00 via INTRAVENOUS
  Administered 2017-08-25: 1000 mL via INTRAVENOUS

## 2017-08-25 MED ORDER — PROPOFOL 500 MG/50ML IV EMUL
INTRAVENOUS | Status: DC | PRN
  Administered 2017-08-25: 160 ug/kg/min via INTRAVENOUS

## 2017-08-25 MED ORDER — FENTANYL CITRATE (PF) 100 MCG/2ML IJ SOLN
INTRAMUSCULAR | Status: AC
  Filled 2017-08-25: qty 2

## 2017-08-25 MED ORDER — PROPOFOL 500 MG/50ML IV EMUL
INTRAVENOUS | Status: AC
  Filled 2017-08-25: qty 50

## 2017-08-25 MED ORDER — PROPOFOL 10 MG/ML IV BOLUS
INTRAVENOUS | Status: AC
  Filled 2017-08-25: qty 20

## 2017-08-25 MED ORDER — FENTANYL CITRATE (PF) 100 MCG/2ML IJ SOLN
INTRAMUSCULAR | Status: DC | PRN
  Administered 2017-08-25 (×2): 50 ug via INTRAVENOUS

## 2017-08-25 MED ORDER — LIDOCAINE HCL (PF) 1 % IJ SOLN
2.0000 mL | Freq: Once | INTRAMUSCULAR | Status: AC
  Administered 2017-08-25: 0.3 mL via INTRADERMAL

## 2017-08-25 MED ORDER — PROPOFOL 10 MG/ML IV BOLUS
INTRAVENOUS | Status: DC | PRN
  Administered 2017-08-25: 100 mg via INTRAVENOUS

## 2017-08-25 MED ORDER — LIDOCAINE HCL (PF) 2 % IJ SOLN
INTRAMUSCULAR | Status: AC
  Filled 2017-08-25: qty 10

## 2017-08-25 MED ORDER — LIDOCAINE 2% (20 MG/ML) 5 ML SYRINGE
INTRAMUSCULAR | Status: DC | PRN
  Administered 2017-08-25: 40 mg via INTRAVENOUS

## 2017-08-25 NOTE — Transfer of Care (Signed)
Immediate Anesthesia Transfer of Care Note  Patient: Kristin Macias  Procedure(s) Performed: ESOPHAGOGASTRODUODENOSCOPY (EGD) WITH PROPOFOL (N/A ) COLONOSCOPY WITH PROPOFOL (N/A )  Patient Location: PACU and Endoscopy Unit  Anesthesia Type:General  Level of Consciousness: drowsy  Airway & Oxygen Therapy: Patient Spontanous Breathing and Patient connected to nasal cannula oxygen  Post-op Assessment: Report given to RN and Post -op Vital signs reviewed and stable  Post vital signs: Reviewed and stable  Last Vitals:  Vitals:   08/25/17 1117  BP: 114/84  Pulse: 98  Resp: 17  Temp: 37.4 C  SpO2: 98%    Last Pain:  Vitals:   08/25/17 1117  TempSrc: Tympanic         Complications: No apparent anesthesia complications

## 2017-08-25 NOTE — H&P (Signed)
Wyline Mood, MD [address], [place], [place], [address] [address], [place], [place], [address] Phone: [phone]  Fax: [phone]  Primary Care Physician:  Ellery Plunk, MD   Pre-Procedure History & Physical: HPI:  Kristin Macias is a 62 y.o. female is here for an endoscopy and colonoscopy    Past Medical History:  Diagnosis Date  . Dysrhythmia    murmur  . Heart murmur   . Hypertension     Past Surgical History:  Procedure Laterality Date  . ABDOMINAL HYSTERECTOMY    . CHOLECYSTECTOMY    . COLONOSCOPY      Prior to Admission medications   Medication Sig Start Date End Date Taking? Authorizing Provider  aspirin 81 MG chewable tablet Chew by mouth. 04/06/11   [provider]  hydrochlorothiazide (HYDRODIURIL) 25 MG tablet Take 25 mg by mouth daily. 09/16/15   [provider]  lisinopril (PRINIVIL,ZESTRIL) 20 MG tablet Take 20 mg by mouth. 09/17/15   [provider]  metoprolol succinate (TOPROL-XL) 25 MG 24 hr tablet Take 25 mg by mouth daily.    [provider]  Milk Thistle 500 MG CAPS Take 1,000 mg by mouth.    [provider]  Omega-3 Fatty Acids (FISH OIL) 1000 MG CAPS Take by mouth. 04/06/11   [provider]  omeprazole (PRILOSEC) 40 MG capsule Take 40 mg by mouth. 05/27/17 05/27/18  [provider]  vitamin B-12 (CYANOCOBALAMIN) 1000 MCG tablet Take 1 tablet by mouth daily.    [provider]  vitamin E 1000 UNIT capsule Take by mouth.    [provider]    Allergies as of 08/18/2017 - Review Complete 08/18/2017  Allergen Reaction Noted  . Amlodipine  10/14/2015  . Tetracycline  10/14/2015    History reviewed. No pertinent family history.  Social History   Socioeconomic History  . Marital status: Married    Spouse name: Not on file  . Number of children: Not on file  . Years of education: Not on file  . Highest education level: Not on  file  Social Needs  . Financial resource strain: Not on file  . Food insecurity - worry: Not on file  . Food insecurity - inability: Not on file  . Transportation needs - medical: Not on file  . Transportation needs - non-medical: Not on file  Occupational History  . Not on file  Tobacco Use  . Smoking status: Former Games developer  . Smokeless tobacco: Never Used  Substance and Sexual Activity  . Alcohol use: Yes    Alcohol/week: 0.0 oz    Comment: occasional wine  . Drug use: No  . Sexual activity: Not on file  Other Topics Concern  . Not on file  Social History Narrative  . Not on file    Review of Systems: See HPI, otherwise negative ROS  Physical Exam: BP 114/84   Pulse 98   Temp 99.4 F (37.4 C) (Tympanic)   Resp 17   Ht 5\' 4"  (1.626 m)   Wt 143 lb (64.9 kg)   SpO2 98%   BMI 24.55 kg/m  General:   Alert,  pleasant and cooperative in NAD Head:  Normocephalic and atraumatic. Neck:  Supple; no masses or thyromegaly. Lungs:  Clear throughout to auscultation, normal respiratory effort.    Heart:  +S1, +S2, Regular rate and rhythm, No edema. Abdomen:  Soft, nontender and nondistended. Normal bowel sounds, without guarding, and without  rebound.   Neurologic:  Alert and  oriented x4;  grossly normal neurologically.  Impression/Plan: Pearson GrippeCheryl G Counihan is here for an endoscopy and colonoscopy  to be performed for  evaluation of dyspepsia and surveillance due to prior history of colon polyps.     Risks, benefits, limitations, and alternatives regarding endoscopy have been reviewed with the patient.  Questions have been answered.  All parties agreeable.   Wyline MoodKiran Nayah Lukens, MD  08/25/2017, 11:56 AM

## 2017-08-25 NOTE — Op Note (Signed)
[facility] Gastroenterology Patient Name: Kristin RobinsonsCheryl Travaglini Procedure Date: 08/25/2017 12:09 PM MRN: [id number] Account #: [id number] Date of Birth: [birth year] Admit Type: Outpatient Age: 62 [facility] Gender: Female Note Status: Finalized Procedure:            Colonoscopy Indications:          High risk colon cancer surveillance: Personal history                        of colonic polyps Providers:            Wyline MoodKiran Rodarius Kichline MD, MD Medicines:            Monitored Anesthesia Care Complications:        No immediate complications. Procedure:            Pre-Anesthesia Assessment:                       - Prior to the procedure, a History and Physical was                        performed, and patient medications, allergies and                        sensitivities were reviewed. The patient's tolerance of                        previous anesthesia was reviewed.                       - The risks and benefits of the procedure and the                        sedation options and risks were discussed with the                        patient. All questions were answered and informed                        consent was obtained.                       - ASA Grade Assessment: II - A patient with mild                        systemic disease.                       After obtaining informed consent, the colonoscope was                        passed under direct vision. Throughout the procedure,                        the patient's blood pressure, pulse, and oxygen                        saturations were monitored continuously. The                        Colonoscope was introduced through the anus and  advanced to the the cecum, identified by the                        appendiceal orifice, IC valve and transillumination.                        The colonoscopy was performed with ease. The patient                        tolerated the procedure well. The quality  of the bowel                        preparation was good. Findings:      The perianal and digital rectal examinations were normal.      Multiple medium-mouthed diverticula were found in the sigmoid colon.      The exam was otherwise without abnormality on direct and retroflexion       views. Impression:           - Diverticulosis in the sigmoid colon.                       - The examination was otherwise normal on direct and                        retroflexion views.                       - No specimens collected. Recommendation:       - Discharge patient to home (with escort).                       - Resume previous diet.                       - Continue present medications.                       - Repeat colonoscopy in 5 years for surveillance.                       - Return to GI office in 8 weeks. Procedure Code(s):    --- Professional ---                       [phone], Colonoscopy, flexible; diagnostic, including                        collection of specimen(s) by brushing or washing, when                        performed (separate procedure) Diagnosis Code(s):    --- Professional ---                       Z86.010, Personal history of colonic polyps                       K57.30, Diverticulosis of large intestine without                        perforation or abscess without bleeding CPT copyright 2016 American Medical Association. All rights reserved. The codes documented in this  report are preliminary and upon coder review may  be revised to meet current compliance requirements. Wyline Mood, MD Wyline Mood MD, MD 08/25/2017 12:47:25 PM This report has been signed electronically. Number of Addenda: 0 Note Initiated On: 08/25/2017 12:09 PM Scope Withdrawal Time: 0 hours 10 minutes 41 seconds  Total Procedure Duration: 0 hours 16 minutes 9 seconds       [facility]

## 2017-08-25 NOTE — Anesthesia Post-op Follow-up Note (Signed)
Anesthesia QCDR form completed.        

## 2017-08-25 NOTE — Op Note (Signed)
[facility] Gastroenterology Patient Name: Kristin RobinsonsCheryl Daoust Procedure Date: 08/25/2017 12:10 PM MRN: [id number] Account #: [id number] Date of Birth: Feb 27, 1956 Admit Type: Outpatient Age: 62 [facility] Gender: Female Note Status: Finalized Procedure:            Upper GI endoscopy Indications:          Dyspepsia Providers:            Wyline MoodKiran Jahrel Borthwick MD, MD Referring MD:         Ellery PlunkKathleen K. Barnhouse MD (Referring MD) Medicines:            Monitored Anesthesia Care Complications:        No immediate complications. Procedure:            Pre-Anesthesia Assessment:                       - Prior to the procedure, a History and Physical was                        performed, and patient medications, allergies and                        sensitivities were reviewed. The patient's tolerance of                        previous anesthesia was reviewed.                       - The risks and benefits of the procedure and the                        sedation options and risks were discussed with the                        patient. All questions were answered and informed                        consent was obtained.                       - ASA Grade Assessment: II - A patient with mild                        systemic disease.                       After obtaining informed consent, the endoscope was                        passed under direct vision. Throughout the procedure,                        the patient's blood pressure, pulse, and oxygen                        saturations were monitored continuously. The                        Colonoscope was introduced through the mouth, and  advanced to the third part of duodenum. The upper GI                        endoscopy was accomplished with ease. The patient                        tolerated the procedure well. Findings:      The examined duodenum was normal.      The entire examined stomach was normal.  Biopsies were taken with a cold       forceps for histology.      The examined esophagus was normal.      The exam was otherwise without abnormality. Impression:           - Normal examined duodenum.                       - Normal stomach. Biopsied.                       - Normal esophagus.                       - The examination was otherwise normal. Recommendation:       - Await pathology results.                       - Perform a colonoscopy today. Procedure Code(s):    --- Professional ---                       [phone], Esophagogastroduodenoscopy, flexible, transoral;                        with biopsy, single or multiple Diagnosis Code(s):    --- Professional ---                       R10.13, Epigastric pain CPT copyright 2016 American Medical Association. All rights reserved. The codes documented in this report are preliminary and upon coder review may  be revised to meet current compliance requirements. Wyline Mood, MD Wyline Mood MD, MD 08/25/2017 12:27:57 PM This report has been signed electronically. Number of Addenda: 0 Note Initiated On: 08/25/2017 12:10 PM      [facility]

## 2017-08-25 NOTE — Anesthesia Postprocedure Evaluation (Signed)
Anesthesia Post Note  Patient: Kristin Macias  Procedure(s) Performed: ESOPHAGOGASTRODUODENOSCOPY (EGD) WITH PROPOFOL (N/A ) COLONOSCOPY WITH PROPOFOL (N/A )  Patient location during evaluation: PACU Anesthesia Type: General Level of consciousness: awake and alert and oriented Pain management: pain level controlled Vital Signs Assessment: post-procedure vital signs reviewed and stable Respiratory status: spontaneous breathing Cardiovascular status: blood pressure returned to baseline Anesthetic complications: no     Last Vitals:  Vitals:   08/25/17 1256 08/25/17 1258  BP: (!) 101/53 124/78  Pulse: 74 78  Resp: 13 16  Temp: (!) 35.7 C (!) 35.6 C  SpO2: 100% 100%    Last Pain:  Vitals:   08/25/17 1256  TempSrc: Tympanic                 Yomaris Palecek

## 2017-08-25 NOTE — Anesthesia Preprocedure Evaluation (Signed)
Anesthesia Evaluation  Patient identified by MRN, date of birth, ID band Patient awake    Reviewed: Allergy & Precautions, NPO status , Patient's Chart, lab work & pertinent test results, reviewed documented beta blocker date and time   Airway Mallampati: II  TM Distance: >3 FB     Dental no notable dental hx.    Pulmonary former smoker,    Pulmonary exam normal        Cardiovascular hypertension, Pt. on medications and Pt. on home beta blockers Normal cardiovascular exam+ dysrhythmias      Neuro/Psych negative neurological ROS  negative psych ROS   GI/Hepatic Neg liver ROS, GERD  Medicated,  Endo/Other  negative endocrine ROS  Renal/GU negative Renal ROS     Musculoskeletal negative musculoskeletal ROS (+)   Abdominal Normal abdominal exam  (+)   Peds  Hematology negative hematology ROS (+)   Anesthesia Other Findings   Reproductive/Obstetrics                             Anesthesia Physical Anesthesia Plan  ASA: II  Anesthesia Plan: General   Post-op Pain Management:    Induction: Intravenous  PONV Risk Score and Plan:   Airway Management Planned: Nasal Cannula  Additional Equipment:   Intra-op Plan:   Post-operative Plan:   Informed Consent: I have reviewed the patients History and Physical, chart, labs and discussed the procedure including the risks, benefits and alternatives for the proposed anesthesia with the patient or authorized representative who has indicated his/her understanding and acceptance.   Dental advisory given  Plan Discussed with: CRNA and Surgeon  Anesthesia Plan Comments:         Anesthesia Quick Evaluation

## 2017-08-26 ENCOUNTER — Encounter: Payer: Self-pay | Admitting: Gastroenterology

## 2017-08-27 LAB — SURGICAL PATHOLOGY

## 2017-08-29 ENCOUNTER — Encounter: Payer: Self-pay | Admitting: Gastroenterology

## 2017-09-29 ENCOUNTER — Ambulatory Visit (INDEPENDENT_AMBULATORY_CARE_PROVIDER_SITE_OTHER): Payer: Managed Care, Other (non HMO) | Admitting: Gastroenterology

## 2017-09-29 ENCOUNTER — Encounter: Payer: Self-pay | Admitting: Gastroenterology

## 2017-09-29 ENCOUNTER — Encounter (INDEPENDENT_AMBULATORY_CARE_PROVIDER_SITE_OTHER): Payer: Self-pay

## 2017-09-29 VITALS — BP 128/80 | HR 73 | Temp 97.9°F | Ht 64.0 in | Wt 143.8 lb

## 2017-09-29 DIAGNOSIS — R1013 Epigastric pain: Secondary | ICD-10-CM | POA: Diagnosis not present

## 2017-09-29 NOTE — Progress Notes (Signed)
Wyline MoodKiran Ziara Thelander MD, MRCP(U.K) [address]  [address]  [place], [place] [address]  Main: [phone]  Fax: [phone]   Primary Care Physician: Ellery PlunkBarnhouse, Kathleen K, MD  Primary Gastroenterologist:  Dr. Wyline MoodKiran Cheral Cappucci   Chief Complaint  Patient presents with  . Gastroesophageal Reflux  . Abdominal Pain    HPI: Pearson GrippeCheryl G Savich is a 62 y.o. female   Summary of history :  She is here today to follow up for abdominal pain. She was initially seen on 08/18/17 for reflux about a year in duration. Burning in the throat , no aggrevating factors but relieved with time, no weight loss. On omeprazole 40 mg once a day , daly bowel movements My impression was she was consuming artifical sugars which maybe causing a lot of gas and associated pain   Interval history   08/18/2016-  2/29/2018   08/25/17- colonoscopy- diverticulosis otherwise normal , EGD -normal . H pylori stool antigen was negative. Gastric biopsies showed mild reactive gastropathy.   She has cut down on artificial sugars in her diet , still consumes a bit of diet sodas. Still heart burn. On omeprazole on once a day . Abdominal pain is much better . Does not want a CT scan at this time. Elevating head end of the bed. Constipation doing well, feels wine helps her.   Current Outpatient Medications  Medication Sig Dispense Refill  . aspirin 81 MG chewable tablet Chew by mouth.    Marland Kitchen. BIOTIN PO Take by mouth.    Marland Kitchen. GARLIC PO Take by mouth.    . hydrochlorothiazide (HYDRODIURIL) 25 MG tablet Take 25 mg by mouth daily.  2  . lisinopril (PRINIVIL,ZESTRIL) 20 MG tablet Take 20 mg by mouth.    . metoprolol succinate (TOPROL-XL) 25 MG 24 hr tablet Take 25 mg by mouth daily.    . Milk Thistle 500 MG CAPS Take 1,000 mg by mouth.    . Red Yeast Rice Extract (RED YEAST RICE PO) Take by mouth.    . vitamin E 1000 UNIT capsule Take by mouth.    . Omega-3 Fatty Acids (FISH OIL) 1000 MG CAPS Take by mouth.    . vitamin B-12 (CYANOCOBALAMIN) 1000  MCG tablet Take 1 tablet by mouth daily.     No current facility-administered medications for this visit.     Allergies as of 09/29/2017 - Review Complete 09/29/2017  Allergen Reaction Noted  . Amlodipine  10/14/2015  . Tetracycline  10/14/2015    ROS:  General: Negative for anorexia, weight loss, fever, chills, fatigue, weakness. ENT: Negative for hoarseness, difficulty swallowing , nasal congestion. CV: Negative for chest pain, angina, palpitations, dyspnea on exertion, peripheral edema.  Respiratory: Negative for dyspnea at rest, dyspnea on exertion, cough, sputum, wheezing.  GI: See history of present illness. GU:  Negative for dysuria, hematuria, urinary incontinence, urinary frequency, nocturnal urination.  Endo: Negative for unusual weight change.    Physical Examination:   BP 128/80 (BP Location: Left Arm, Patient Position: Sitting, Cuff Size: Normal)   Pulse 73   Temp 97.9 F (36.6 C) (Oral)   Ht 5\' 4"  (1.626 m)   Wt 143 lb 12.8 oz (65.2 kg)   BMI 24.68 kg/m   General: Well-nourished, well-developed in no acute distress.  Eyes: No icterus. Conjunctivae pink. Mouth: Oropharyngeal mucosa moist and pink , no lesions erythema or exudate. Lungs: Clear to auscultation bilaterally. Non-labored. Heart: Regular rate and rhythm, no murmurs rubs or gallops.  Abdomen: Bowel sounds are  normal, nontender, nondistended, no hepatosplenomegaly or masses, no abdominal bruits or hernia , no rebound or guarding.   Extremities: No lower extremity edema. No clubbing or deformities. Neuro: Alert and oriented x 3.  Grossly intact. Skin: Warm and dry, no jaundice.   Psych: Alert and cooperative, normal mood and affect.   Imaging Studies: No results found.  Assessment and Plan:   CRISTAL QADIR is a 62 y.o. y/o female here to follow up for abdominal pain/dyspepsia. May have an element of pain from gas due to artificial sugars/constipation as she has responded to cutting down on  sugars and has relief after a good bowel movement, she has not been taking her miralax daily  1. Add zantac at night , continue omeprazole 40 mg once a day  2. Stop all artificial sugars .  3. Miralax every day to have 1 soft bowel movement .   Dr Wyline Mood  MD,MRCP [facility]) Follow up in 3 months

## 2018-01-04 ENCOUNTER — Ambulatory Visit: Payer: Self-pay | Admitting: Family Medicine

## 2018-01-04 VITALS — BP 113/65 | HR 70 | Temp 98.9°F | Resp 16 | Ht 64.0 in | Wt 142.0 lb

## 2018-01-04 DIAGNOSIS — Z0189 Encounter for other specified special examinations: Principal | ICD-10-CM

## 2018-01-04 DIAGNOSIS — Z008 Encounter for other general examination: Secondary | ICD-10-CM

## 2018-01-04 LAB — GLUCOSE, POCT (MANUAL RESULT ENTRY): POC GLUCOSE: 108 mg/dL — AB (ref 70–99)

## 2018-01-04 NOTE — Progress Notes (Signed)
Subjective: Annual biometrics screening  Patient presents for her annual biometric screening. Patient reports eating a healthy, well-rounded diet and getting regular exercise.  Patient denies any history of impaired fasting glucose or diabetes.  Patient regularly sees her primary care provider.  Patient reports she has had a nonproductive cough, clear rhinorrhea, sneezing, and mild sore throat for 2 days.  Denies fever, chills, shortness of breath, wheezing, chest or back pain, fatigue, body aches, facial pressure, purulent nasal discharge, or any other symptoms.  Denies severe symptoms or initial improvement and then worsening of symptoms.  Patient reports overall her symptoms have been mild. Patient denies any other issues or concerns.   Review of Systems Unremarkable  Objective  Physical Exam General: Awake, alert and oriented. No acute distress. Well developed, hydrated and nourished. Appears stated age.  HEENT: Supple neck without adenopathy. Sclera is non-icteric. The ear canal is clear without discharge. The tympanic membrane is normal in appearance with normal landmarks and cone of light. Nasal mucosa is moist with mild erythema/edema. Oral mucosa is pink and moist. The pharynx is normal in appearance without tonsillar swelling or exudates.  No erythema or edema to oropharynx.  Sinuses nontender. Skin: Skin in warm, dry and intact without rashes or lesions. Appropriate color for ethnicity. Cardiac: Heart rate and rhythm are normal. No murmurs, gallops, or rubs are auscultated.  Respiratory: The chest wall is symmetric and without deformity. No signs of respiratory distress. Lung sounds are clear in all lobes bilaterally without rales, ronchi, or wheezes.  Neurological: The patient is awake, alert and oriented to person, place, and time with normal speech.  Memory is normal and thought processes intact. No gait abnormalities are appreciated.  Psychiatric: Appropriate mood and affect.    Assessment Annual biometrics screening Acute viral upper respiratory infection  Plan  Lipid panel pending. Encouraged routine visits with primary care provider.  Fasting blood sugar is 108 today.  Discussed normal values and impaired fasting glucose.  Advised patient follow-up with her primary care provider regarding this. Encouraged patient to get regular exercise and eat a healthy, well-rounded diet. Discussed OTC treatments for acute viral upper respiratory infection and recommended nasal saline spray.  Discussed red flag symptoms and indications to seek medical care.

## 2018-01-05 LAB — LIPID PANEL
CHOLESTEROL TOTAL: 194 mg/dL (ref 100–199)
Chol/HDL Ratio: 3.4 ratio (ref 0.0–4.4)
HDL: 57 mg/dL (ref >39)
LDL CALC: 107 mg/dL — AB (ref 0–99)
Triglycerides: 151 mg/dL — ABNORMAL HIGH (ref 0–149)
VLDL CHOLESTEROL CAL: 30 mg/dL (ref 5–40)

## 2018-01-05 NOTE — Progress Notes (Signed)
Kristin Macias, Will you call the patient and inform them that their lipid panel came back?  Everything is normal, with the exception of her triglyceride level and LDL cholesterol. The triglyceride level is elevated at 151, normal values are between 0 and 149.  The LDL cholesterol ("bad cholesterol") is elevated at 107, normal values are below 99. Please advise the patient to follow-up with their primary care provider regarding these results.

## 2018-01-12 ENCOUNTER — Ambulatory Visit (INDEPENDENT_AMBULATORY_CARE_PROVIDER_SITE_OTHER): Payer: Managed Care, Other (non HMO) | Admitting: Gastroenterology

## 2018-01-12 ENCOUNTER — Encounter: Payer: Self-pay | Admitting: Gastroenterology

## 2018-01-12 VITALS — BP 128/79 | HR 80 | Ht 64.0 in | Wt 141.0 lb

## 2018-01-12 DIAGNOSIS — K219 Gastro-esophageal reflux disease without esophagitis: Secondary | ICD-10-CM | POA: Diagnosis not present

## 2018-01-12 DIAGNOSIS — R1013 Epigastric pain: Secondary | ICD-10-CM | POA: Diagnosis not present

## 2018-01-12 DIAGNOSIS — K5900 Constipation, unspecified: Secondary | ICD-10-CM

## 2018-01-12 NOTE — Progress Notes (Signed)
Wyline MoodKiran Darsha Zumstein Kristin Macias, MRCP(U.Macias) [address]  [address]  [place], [place] [address]  Main: [phone]  Fax: [phone]   Primary Care Physician: Kristin Macias, Kristin Macias, Kristin Macias  Primary Gastroenterologist:  Dr. Wyline MoodKiran Denni France   No chief complaint on file.   HPI: Kristin Macias is a 62 y.o. female   Summary of history :  She is here today to follow up for dyspepsia She was initially seen on 08/18/17 for reflux about a year in duration. On omeprazole 40 mg once a day , daily bowel movements. My impression was she was consuming artifical sugars which maybe causing a lot of gas and associated pain  08/25/17- colonoscopy- diverticulosis otherwise normal , EGD -normal . H pylori stool antigen was negative. Gastric biopsies showed mild reactive gastropathy.   Interval history  2/29/2018 -01/12/18  Reflux is much better, 2 episodes since last visit. Takes omeprazole daily and zantac at night . She also takes miralax for constipation Used to have a bowel movement daily  .   Stopped artificial sugars.  Current Outpatient Medications  Medication Sig Dispense Refill  . aspirin 81 MG chewable tablet Chew by mouth.    Marland Kitchen. BIOTIN PO Take by mouth.    . hydrochlorothiazide (HYDRODIURIL) 25 MG tablet Take 25 mg by mouth daily.  2  . lisinopril (PRINIVIL,ZESTRIL) 20 MG tablet Take 20 mg by mouth.    . metoprolol succinate (TOPROL-XL) 25 MG 24 hr tablet Take 25 mg by mouth daily.    Marland Kitchen. omeprazole (PRILOSEC) 40 MG capsule Take by mouth.    . vitamin B-12 (CYANOCOBALAMIN) 1000 MCG tablet Take 1 tablet by mouth daily.    . vitamin E 1000 UNIT capsule Take by mouth.     No current facility-administered medications for this visit.     Allergies as of 01/12/2018 - Review Complete 01/04/2018  Allergen Reaction Noted  . Amlodipine  10/14/2015  . Tetracycline  10/14/2015    ROS:  General: Negative for anorexia, weight loss, fever, chills, fatigue, weakness. ENT: Negative for hoarseness, difficulty  swallowing , nasal congestion. CV: Negative for chest pain, angina, palpitations, dyspnea on exertion, peripheral edema.  Respiratory: Negative for dyspnea at rest, dyspnea on exertion, cough, sputum, wheezing.  GI: See history of present illness. GU:  Negative for dysuria, hematuria, urinary incontinence, urinary frequency, nocturnal urination.  Endo: Negative for unusual weight change.    Physical Examination:   There were no vitals taken for this visit.  General: Well-nourished, well-developed in no acute distress.  Eyes: No icterus. Conjunctivae pink. Mouth: Oropharyngeal mucosa moist and pink , no lesions erythema or exudate. Lungs: Clear to auscultation bilaterally. Non-labored. Heart: Regular rate and rhythm, no murmurs rubs or gallops.  Abdomen: Bowel sounds are normal, nontender, nondistended, no hepatosplenomegaly or masses, no abdominal bruits or hernia , no rebound or guarding.   Extremities: No lower extremity edema. No clubbing or deformities. Neuro: Alert and oriented x 3.  Grossly intact. Skin: Warm and dry, no jaundice.   Psych: Alert and cooperative, normal mood and affect.   Imaging Studies: No results found.  Assessment and Plan:   Kristin GrippeCheryl G Genco is a 62 y.o. y/o female here to follow up for abdominal pain/dyspepsia. Likely a combination of  Reflux, constipation and probably some gas related to constipation. Doing over all much better, still a bit of constipation and gas .   1. Continue PPI and zantac, avoid meals for 2 hours before bed time-GERd patient information  2. Continue  to stop all artficial sugars 3. Can try charcoal tablet for bloating  4. Trial of IB guard- 2 weeks samples provided .  5. Increase dose of Miralax for a few days to BID.   Dr Wyline Mood  Kristin Macias,MRCP [facility]) Follow up 6 months.

## 2018-01-12 NOTE — Patient Instructions (Signed)
Gastroesophageal Reflux Disease, Adult Normally, food travels down the esophagus and stays in the stomach to be digested. However, when a person has gastroesophageal reflux disease (GERD), food and stomach acid move back up into the esophagus. When this happens, the esophagus becomes sore and inflamed. Over time, GERD can create small holes (ulcers) in the lining of the esophagus. What are the causes? This condition is caused by a problem with the muscle between the esophagus and the stomach (lower esophageal sphincter, or LES). Normally, the LES muscle closes after food passes through the esophagus to the stomach. When the LES is weakened or abnormal, it does not close properly, and that allows food and stomach acid to go back up into the esophagus. The LES can be weakened by certain dietary substances, medicines, and medical conditions, including:  Tobacco use.  Pregnancy.  Having a hiatal hernia.  Heavy alcohol use.  Certain foods and beverages, such as coffee, chocolate, onions, and peppermint.  What increases the risk? This condition is more likely to develop in:  People who have an increased body weight.  People who have connective tissue disorders.  People who use NSAID medicines.  What are the signs or symptoms? Symptoms of this condition include:  Heartburn.  Difficult or painful swallowing.  The feeling of having a lump in the throat.  Abitter taste in the mouth.  Bad breath.  Having a large amount of saliva.  Having an upset or bloated stomach.  Belching.  Chest pain.  Shortness of breath or wheezing.  Ongoing (chronic) cough or a night-time cough.  Wearing away of tooth enamel.  Weight loss.  Different conditions can cause chest pain. Make sure to see your health care provider if you experience chest pain. How is this diagnosed? Your health care provider will take a medical history and perform a physical exam. To determine if you have mild or severe  GERD, your health care provider may also monitor how you respond to treatment. You may also have other tests, including:  An endoscopy toexamine your stomach and esophagus with a small camera.  A test thatmeasures the acidity level in your esophagus.  A test thatmeasures how much pressure is on your esophagus.  A barium swallow or modified barium swallow to show the shape, size, and functioning of your esophagus.  How is this treated? The goal of treatment is to help relieve your symptoms and to prevent complications. Treatment for this condition may vary depending on how severe your symptoms are. Your health care provider may recommend:  Changes to your diet.  Medicine.  Surgery.  Follow these instructions at home: Diet  Follow a diet as recommended by your health care provider. This may involve avoiding foods and drinks such as: ? Coffee and tea (with or without caffeine). ? Drinks that containalcohol. ? Energy drinks and sports drinks. ? Carbonated drinks or sodas. ? Chocolate and cocoa. ? Peppermint and mint flavorings. ? Garlic and onions. ? Horseradish. ? Spicy and acidic foods, including peppers, chili powder, curry powder, vinegar, hot sauces, and barbecue sauce. ? Citrus fruit juices and citrus fruits, such as oranges, lemons, and limes. ? Tomato-based foods, such as red sauce, chili, salsa, and pizza with red sauce. ? Fried and fatty foods, such as donuts, french fries, potato chips, and high-fat dressings. ? High-fat meats, such as hot dogs and fatty cuts of red and white meats, such as rib eye steak, sausage, ham, and bacon. ? High-fat dairy items, such as whole milk,   butter, and cream cheese.  Eat small, frequent meals instead of large meals.  Avoid drinking large amounts of liquid with your meals.  Avoid eating meals during the 2-3 hours before bedtime.  Avoid lying down right after you eat.  Do not exercise right after you eat. General  instructions  Pay attention to any changes in your symptoms.  Take over-the-counter and prescription medicines only as told by your health care provider. Do not take aspirin, ibuprofen, or other NSAIDs unless your health care provider told you to do so.  Do not use any tobacco products, including cigarettes, chewing tobacco, and e-cigarettes. If you need help quitting, ask your health care provider.  Wear loose-fitting clothing. Do not wear anything tight around your waist that causes pressure on your abdomen.  Raise (elevate) the head of your bed 6 inches (15cm).  Try to reduce your stress, such as with yoga or meditation. If you need help reducing stress, ask your health care provider.  If you are overweight, reduce your weight to an amount that is healthy for you. Ask your health care provider for guidance about a safe weight loss goal.  Keep all follow-up visits as told by your health care provider. This is important. Contact a health care provider if:  You have new symptoms.  You have unexplained weight loss.  You have difficulty swallowing, or it hurts to swallow.  You have wheezing or a persistent cough.  Your symptoms do not improve with treatment.  You have a hoarse voice. Get help right away if:  You have pain in your arms, neck, jaw, teeth, or back.  You feel sweaty, dizzy, or light-headed.  You have chest pain or shortness of breath.  You vomit and your vomit looks like blood or coffee grounds.  You faint.  Your stool is bloody or black.  You cannot swallow, drink, or eat. This information is not intended to replace advice given to you by your health care provider. Make sure you discuss any questions you have with your health care provider. Document Released: 05/06/2005 Document Revised: 12/25/2015 Document Reviewed: 11/21/2014 Elsevier Interactive Patient Education  2018 Elsevier Inc.  

## 2018-04-20 ENCOUNTER — Encounter: Payer: Self-pay | Admitting: Obstetrics and Gynecology

## 2018-04-20 ENCOUNTER — Other Ambulatory Visit (HOSPITAL_COMMUNITY)
Admission: RE | Admit: 2018-04-20 | Discharge: 2018-04-20 | Disposition: A | Discharge status: Home / Self Care | Payer: Managed Care, Other (non HMO) | Source: Ambulatory Visit | Attending: Obstetrics and Gynecology | Admitting: Obstetrics and Gynecology

## 2018-04-20 ENCOUNTER — Ambulatory Visit (INDEPENDENT_AMBULATORY_CARE_PROVIDER_SITE_OTHER): Payer: Managed Care, Other (non HMO) | Admitting: Obstetrics and Gynecology

## 2018-04-20 VITALS — BP 120/80 | HR 62 | Ht 66.0 in | Wt 142.0 lb

## 2018-04-20 DIAGNOSIS — N952 Postmenopausal atrophic vaginitis: Secondary | ICD-10-CM

## 2018-04-20 DIAGNOSIS — Z01411 Encounter for gynecological examination (general) (routine) with abnormal findings: Secondary | ICD-10-CM | POA: Diagnosis not present

## 2018-04-20 DIAGNOSIS — Z1151 Encounter for screening for human papillomavirus (HPV): Secondary | ICD-10-CM | POA: Diagnosis present

## 2018-04-20 DIAGNOSIS — Z1231 Encounter for screening mammogram for malignant neoplasm of breast: Secondary | ICD-10-CM

## 2018-04-20 DIAGNOSIS — Z1239 Encounter for other screening for malignant neoplasm of breast: Secondary | ICD-10-CM

## 2018-04-20 DIAGNOSIS — N941 Unspecified dyspareunia: Secondary | ICD-10-CM | POA: Diagnosis not present

## 2018-04-20 DIAGNOSIS — Z01419 Encounter for gynecological examination (general) (routine) without abnormal findings: Secondary | ICD-10-CM

## 2018-04-20 DIAGNOSIS — Z124 Encounter for screening for malignant neoplasm of cervix: Secondary | ICD-10-CM

## 2018-04-20 MED ORDER — ESTRADIOL 0.1 MG/GM VA CREA: 1.0000 | TOPICAL_CREAM | Freq: Every day | VAGINAL | 1 refills | Status: AC

## 2018-04-20 NOTE — Progress Notes (Signed)
PCP: Ellery Plunk, MD   Chief Complaint  Patient presents with  . Gynecologic Exam    bleeds after sexual intercourse, pt states its painful x one yr or so     HPI:      Kristin Macias is a 62 y.o. [phone] who LMP was No LMP recorded. Patient has had a hysterectomy., presents today for her NP annual examination.  Her menses are absent due to menopause/lap hyst due to DUB. She does not have intermenstrual bleeding. She does have vasomotor sx.   Sex activity: single partner, contraception - status post hysterectomy. She does have vaginal dryness. Has tried lubricants without relief. Has a few min of red bleeding with intercourse.  Last Pap: not recent Hx of STDs: none  Last mammogram: at [facility] this yr per pt report There is no FH of breast cancer. There is no FH of ovarian cancer. The patient does do self-breast exams.  Colonoscopy: this yr;  Repeat due after 5 years.   Tobacco use: The patient denies current or previous tobacco use. Alcohol use: social drinker Exercise: min active  She does not get adequate calcium and Vitamin D in her diet.  Labs at work.   Past Medical History:  Diagnosis Date  . Dysrhythmia    murmur  . Heart murmur   . Hypertension     Past Surgical History:  Procedure Laterality Date  . CHOLECYSTECTOMY    . COLONOSCOPY    . COLONOSCOPY WITH PROPOFOL N/A 08/25/2017   Procedure: COLONOSCOPY WITH PROPOFOL;  Surgeon: Wyline Mood, MD;  Location: [facility] ENDOSCOPY;  Service: Gastroenterology;  Laterality: N/A;  . ESOPHAGOGASTRODUODENOSCOPY (EGD) WITH PROPOFOL N/A 08/25/2017   Procedure: ESOPHAGOGASTRODUODENOSCOPY (EGD) WITH PROPOFOL;  Surgeon: Wyline Mood, MD;  Location: [facility] ENDOSCOPY;  Service: Gastroenterology;  Laterality: N/A;  . LAPAROSCOPIC HYSTERECTOMY     DUB    Family History  Problem Relation Age of Onset  . Lung cancer Mother   . Breast cancer Neg Hx   . Ovarian cancer Neg Hx     Social History   Socioeconomic History    . Marital status: Married    Spouse name: Not on file  . Number of children: Not on file  . Years of education: Not on file  . Highest education level: Not on file  Occupational History  . Not on file  Social Needs  . Financial resource strain: Not on file  . Food insecurity:    Worry: Not on file    Inability: Not on file  . Transportation needs:    Medical: Not on file    Non-medical: Not on file  Tobacco Use  . Smoking status: Former Games developer  . Smokeless tobacco: Never Used  Substance and Sexual Activity  . Alcohol use: Yes    Alcohol/week: 0.0 standard drinks    Comment: occasional wine  . Drug use: No  . Sexual activity: Yes    Birth control/protection: Surgical    Comment: Hysterectomy  Lifestyle  . Physical activity:    Days per week: Not on file    Minutes per session: Not on file  . Stress: Not on file  Relationships  . Social connections:    Talks on phone: Not on file    Gets together: Not on file    Attends religious service: Not on file    Active member of club or organization: Not on file    Attends meetings of clubs or organizations: Not on file  Relationship status: Not on file  . Intimate partner violence:    Fear of current or ex partner: Not on file    Emotionally abused: Not on file    Physically abused: Not on file    Forced sexual activity: Not on file  Other Topics Concern  . Not on file  Social History Narrative  . Not on file    Outpatient Medications Prior to Visit  Medication Sig Dispense Refill  . aspirin 81 MG chewable tablet Chew by mouth.    Marland Kitchen BIOTIN PO Take by mouth.    Marland Kitchen GARLIC PO Take by mouth.    . hydrochlorothiazide (HYDRODIURIL) 25 MG tablet Take 25 mg by mouth daily.  2  . lisinopril (PRINIVIL,ZESTRIL) 20 MG tablet Take 20 mg by mouth.    . metoprolol succinate (TOPROL-XL) 25 MG 24 hr tablet Take 25 mg by mouth daily.    . Omega-3 1000 MG CAPS Take by mouth.    Marland Kitchen omeprazole (PRILOSEC) 40 MG capsule Take by mouth.     . polyethylene glycol (MIRALAX / GLYCOLAX) packet Take 17 g by mouth daily.    . vitamin E 1000 UNIT capsule Take by mouth.    . vitamin B-12 (CYANOCOBALAMIN) 1000 MCG tablet Take 1 tablet by mouth daily.     No facility-administered medications prior to visit.      ROS:  Review of Systems BREAST: No symptoms    Objective: BP 120/80   Pulse 62   Ht 5\' 6"  (1.676 m)   Wt 142 lb (64.4 kg)   BMI 22.92 kg/m    Physical Exam  Constitutional: She is oriented to person, place, and time. She appears well-developed and well-nourished.  Genitourinary: Vagina normal. There is no rash or tenderness on the right labia. There is no rash or tenderness on the left labia. No erythema or tenderness in the vagina. No vaginal discharge found. Right adnexum does not display mass and does not display tenderness. Left adnexum does not display mass and does not display tenderness.  Genitourinary Comments: UTERUS/CX SURG REM; MOD VAG ATROPHY, SMALLER VAG OPENING  Neck: Normal range of motion. No thyromegaly present.  Cardiovascular: Normal rate, regular rhythm and normal heart sounds.  No murmur heard. Pulmonary/Chest: Effort normal and breath sounds normal. Right breast exhibits no mass, no nipple discharge, no skin change and no tenderness. Left breast exhibits no mass, no nipple discharge, no skin change and no tenderness.  Abdominal: Soft. There is no tenderness. There is no guarding.  Musculoskeletal: Normal range of motion.  Neurological: She is alert and oriented to person, place, and time. No cranial nerve deficit.  Psychiatric: She has a normal mood and affect. Her behavior is normal.  Vitals reviewed.   Assessment/Plan:  Encounter for annual routine gynecological examination  Cervical cancer screening - Plan: Cytology - PAP  Screening for HPV (human papillomavirus) - Plan: Cytology - PAP  Screening for breast cancer - Pt current on mammo. - Plan: CANCELED: MM 3D SCREEN BREAST  BILATERAL  Dyspareunia in female - Due to vag atrophy. Try vag ERT/coconut oil. Rx estrace crm. F/u prn.  - Plan: estradiol (ESTRACE) 0.1 MG/GM vaginal cream  Postmenopausal atrophic vaginitis - Plan: estradiol (ESTRACE) 0.1 MG/GM vaginal cream   Meds ordered this encounter  Medications  . estradiol (ESTRACE) 0.1 MG/GM vaginal cream    Sig: Place 1 Applicatorful vaginally at bedtime. Insert 1g nightly for 1 wk, then 1 g once weekly as maintenace    Dispense:  42.5 g    Refill:  1    Order Specific Question:   Supervising Provider    Answer:   Nadara Mustard [[id number]]            GYN counsel breast self exam, mammography screening, menopause, adequate intake of calcium and vitamin D, diet and exercise    F/U  Return in about 1 year (around 04/21/2019).  Willma Obando B. Deepa Barthel, PA-C 04/20/2018 9:29 AM

## 2018-04-20 NOTE — Patient Instructions (Addendum)
I value your feedback and entrusting us with your care. If you get a [facility] patient survey, I would appreciate you taking the time to let us know about your experience today. Thank you! 

## 2018-04-21 LAB — CYTOLOGY - PAP
DIAGNOSIS: NEGATIVE
HPV (WINDOPATH): NOT DETECTED

## 2018-06-08 DIAGNOSIS — Z808 Family history of malignant neoplasm of other organs or systems: Secondary | ICD-10-CM | POA: Insufficient documentation

## 2018-07-14 ENCOUNTER — Encounter: Payer: Self-pay | Admitting: Gastroenterology

## 2018-07-14 ENCOUNTER — Ambulatory Visit (INDEPENDENT_AMBULATORY_CARE_PROVIDER_SITE_OTHER): Payer: Managed Care, Other (non HMO) | Admitting: Gastroenterology

## 2018-07-14 VITALS — BP 157/83 | HR 72 | Ht 66.0 in | Wt 144.0 lb

## 2018-07-14 DIAGNOSIS — K219 Gastro-esophageal reflux disease without esophagitis: Secondary | ICD-10-CM | POA: Diagnosis not present

## 2018-07-14 DIAGNOSIS — K5900 Constipation, unspecified: Secondary | ICD-10-CM

## 2018-07-14 NOTE — Progress Notes (Addendum)
Wyline Mood MD, MRCP(U.K) [address]  [address]  [place], [place] [address]  Main: [phone]  Fax: [phone]   Primary Care Physician: Ellery Plunk, MD  Primary Gastroenterologist:  Dr. Wyline Mood   No chief complaint on file.   HPI: HANNALEE CASTOR is a 62 y.o. female  Summary of history :  She is here today to follow up for dyspepsia She was initially seen on 08/18/17 for reflux about a year in duration. On omeprazole 40 mg once a day,zantac  , daily bowel movements. My impression was she was consuming artifical sugars which maybe causing a lot of gas and associated pain  08/25/17- colonoscopy- diverticulosis otherwise normal , EGD -normal . H pylori stool antigen was negative. Gastric biopsies showed mild reactive gastropathy.  Interval history 01/12/18-07/14/18   Gained 3 lbs . Says her reflux causes her to have symptoms occasionally , every few weeks .While on zantac was find but stopped after recent media reports of concerns.   Takes  miralax for constipation has bowel movement daily  .   Occasionally has some odd nausea. Did not try charcoal - does have some bloating . IB guard did not help much .   Current Outpatient Medications  Medication Sig Dispense Refill  . aspirin 81 MG chewable tablet Chew by mouth.    Marland Kitchen BIOTIN PO Take by mouth.    . estradiol (ESTRACE) 0.1 MG/GM vaginal cream Place 1 Applicatorful vaginally at bedtime. Insert 1g nightly for 1 wk, then 1 g once weekly as maintenace 42.5 g 1  . GARLIC PO Take by mouth.    . hydrochlorothiazide (HYDRODIURIL) 25 MG tablet Take 25 mg by mouth daily.  2  . lisinopril (PRINIVIL,ZESTRIL) 20 MG tablet Take 20 mg by mouth.    . metoprolol succinate (TOPROL-XL) 25 MG 24 hr tablet Take 25 mg by mouth daily.    . Omega-3 1000 MG CAPS Take by mouth.    . polyethylene glycol (MIRALAX / GLYCOLAX) packet Take 17 g by mouth daily.    . vitamin B-12 (CYANOCOBALAMIN) 1000 MCG tablet Take 1 tablet by  mouth daily.    . vitamin E 1000 UNIT capsule Take by mouth.     No current facility-administered medications for this visit.     Allergies as of 07/14/2018 - Review Complete 04/20/2018  Allergen Reaction Noted  . Amlodipine  10/14/2015  . Tetracycline  10/14/2015    ROS:  General: Negative for anorexia, weight loss, fever, chills, fatigue, weakness. ENT: Negative for hoarseness, difficulty swallowing , nasal congestion. CV: Negative for chest pain, angina, palpitations, dyspnea on exertion, peripheral edema.  Respiratory: Negative for dyspnea at rest, dyspnea on exertion, cough, sputum, wheezing.  GI: See history of present illness. GU:  Negative for dysuria, hematuria, urinary incontinence, urinary frequency, nocturnal urination.  Endo: Negative for unusual weight change.    Physical Examination:   There were no vitals taken for this visit.  General: Well-nourished, well-developed in no acute distress.  Eyes: No icterus. Conjunctivae pink. Mouth: Oropharyngeal mucosa moist and pink , no lesions erythema or exudate. Lungs: Clear to auscultation bilaterally. Non-labored. Heart: Regular rate and rhythm, no murmurs rubs or gallops.  Abdomen: Bowel sounds are normal, nontender, nondistended, no hepatosplenomegaly or masses, no abdominal bruits or hernia , no rebound or guarding.   Extremities: No lower extremity edema. No clubbing or deformities. Neuro: Alert and oriented x 3.  Grossly intact. Skin: Warm and dry, no jaundice.   Psych:  Alert and cooperative, normal mood and affect.   Imaging Studies: No results found.  Assessment and Plan:   Pearson GrippeCheryl G Stegmann is a 62 y.o. y/o female here to follow up for GERD and constipation. Constipation is stable on miralax daily. GERD doing reasonably well, some worseing of symptoms since she stopped Zantac, has gained weight .   1.Continue PPI and add pepcid night if needed , avoid meals for 2 hours before bed time. (presently still  eating late) 2. Continue miralax  3. Can try charcoal tablet for bloating  4. Lose some weight .  Dr Wyline MoodKiran Kathryn Cosby  MD,MRCP [facility]) Follow up PRN

## 2019-01-25 ENCOUNTER — Ambulatory Visit: Payer: Managed Care, Other (non HMO) | Admitting: Adult Health

## 2019-01-25 ENCOUNTER — Other Ambulatory Visit: Payer: Self-pay

## 2019-01-25 ENCOUNTER — Encounter: Payer: Self-pay | Admitting: Adult Health

## 2019-01-25 VITALS — BP 142/78 | HR 68 | Temp 99.1°F | Resp 16 | Ht 65.0 in | Wt 136.0 lb

## 2019-01-25 DIAGNOSIS — Z0189 Encounter for other specified special examinations: Secondary | ICD-10-CM

## 2019-01-25 DIAGNOSIS — Z008 Encounter for other general examination: Secondary | ICD-10-CM

## 2019-01-25 NOTE — Patient Instructions (Signed)
I will have the office call you on your glucose and cholesterol results when they return if you have not heard within 1 week please call the office.  This biometric physical is a brief physical and the only labs done are glucose and your lipid panel(cholesterol) and is  not a substitute for seeing a primary care provider for a complete annual physical. Please see a primary care physician for routine health maintenance, labs and full physical at least yearly and follow up as recommended by your provider. Provider also recommends if you do not have a primary care provider for patient to establish care as soon as possible .Patient may chose provider of choice. Also gave the [place]  PHYSICIAN/PROVIDER  REFERRAL LINE at [phone] or web site at [email or web address] to help assist with finding a primary care doctor.  Patient verbalizes understanding that his office is acute care only and not a substitute for a primary care or for the management of chronic conditions.    Follow up with primary care as needed for chronic and maintenance health care- can be seen in this employee clinic for acute care.    Health Maintenance, Female Adopting a healthy lifestyle and getting preventive care can go a long way to promote health and wellness. Talk with your health care provider about what schedule of regular examinations is right for you. This is a good chance for you to check in with your provider about disease prevention and staying healthy. In between checkups, there are plenty of things you can do on your own. Experts have done a lot of research about which lifestyle changes and preventive measures are most likely to keep you healthy. Ask your health care provider for more information. Weight and diet Eat a healthy diet  Be sure to include plenty of vegetables, fruits, low-fat dairy products, and lean protein.  Do not eat a lot of foods high in solid fats, added sugars, or salt.  Get regular exercise.  This is one of the most important things you can do for your health. ? Most adults should exercise for at least 150 minutes each week. The exercise should increase your heart rate and make you sweat (moderate-intensity exercise). ? Most adults should also do strengthening exercises at least twice a week. This is in addition to the moderate-intensity exercise. Maintain a healthy weight  Body mass index (BMI) is a measurement that can be used to identify possible weight problems. It estimates body fat based on height and weight. Your health care provider can help determine your BMI and help you achieve or maintain a healthy weight.  For females 20 years of age and older: ? A BMI below 18.5 is considered underweight. ? A BMI of 18.5 to 24.9 is normal. ? A BMI of 25 to 29.9 is considered overweight. ? A BMI of 30 and above is considered obese. Watch levels of cholesterol and blood lipids  You should start having your blood tested for lipids and cholesterol at 63 years of age, then have this test every 5 years.  You may need to have your cholesterol levels checked more often if: ? Your lipid or cholesterol levels are high. ? You are older than 63 years of age. ? You are at high risk for heart disease. Cancer screening Lung Cancer  Lung cancer screening is recommended for adults 55-80 years old who are at high risk for lung cancer because of a history of smoking.  A yearly low-dose CT scan   of the lungs is recommended for people who: ? Currently smoke. ? Have quit within the past 15 years. ? Have at least a 30-pack-year history of smoking. A pack year is smoking an average of one pack of cigarettes a day for 1 year.  Yearly screening should continue until it has been 15 years since you quit.  Yearly screening should stop if you develop a health problem that would prevent you from having lung cancer treatment. Breast Cancer  Practice breast self-awareness. This means understanding how your  breasts normally appear and feel.  It also means doing regular breast self-exams. Let your health care provider know about any changes, no matter how small.  If you are in your 20s or 30s, you should have a clinical breast exam (CBE) by a health care provider every 1-3 years as part of a regular health exam.  If you are 40 or older, have a CBE every year. Also consider having a breast X-ray (mammogram) every year.  If you have a family history of breast cancer, talk to your health care provider about genetic screening.  If you are at high risk for breast cancer, talk to your health care provider about having an MRI and a mammogram every year.  Breast cancer gene (BRCA) assessment is recommended for women who have family members with BRCA-related cancers. BRCA-related cancers include: ? Breast. ? Ovarian. ? Tubal. ? Peritoneal cancers.  Results of the assessment will determine the need for genetic counseling and BRCA1 and BRCA2 testing. Cervical Cancer Your health care provider may recommend that you be screened regularly for cancer of the pelvic organs (ovaries, uterus, and vagina). This screening involves a pelvic examination, including checking for microscopic changes to the surface of your cervix (Pap test). You may be encouraged to have this screening done every 3 years, beginning at age 21.  For women ages 30-65, health care providers may recommend pelvic exams and Pap testing every 3 years, or they may recommend the Pap and pelvic exam, combined with testing for human papilloma virus (HPV), every 5 years. Some types of HPV increase your risk of cervical cancer. Testing for HPV may also be done on women of any age with unclear Pap test results.  Other health care providers may not recommend any screening for nonpregnant women who are considered low risk for pelvic cancer and who do not have symptoms. Ask your health care provider if a screening pelvic exam is right for you.  If you  have had past treatment for cervical cancer or a condition that could lead to cancer, you need Pap tests and screening for cancer for at least 20 years after your treatment. If Pap tests have been discontinued, your risk factors (such as having a new sexual partner) need to be reassessed to determine if screening should resume. Some women have medical problems that increase the chance of getting cervical cancer. In these cases, your health care provider may recommend more frequent screening and Pap tests. Colorectal Cancer  This type of cancer can be detected and often prevented.  Routine colorectal cancer screening usually begins at 63 years of age and continues through 63 years of age.  Your health care provider may recommend screening at an earlier age if you have risk factors for colon cancer.  Your health care provider may also recommend using home test kits to check for hidden blood in the stool.  A small camera at the end of a tube can be used to examine   your colon directly (sigmoidoscopy or colonoscopy). This is done to check for the earliest forms of colorectal cancer.  Routine screening usually begins at age 50.  Direct examination of the colon should be repeated every 5-10 years through 63 years of age. However, you may need to be screened more often if early forms of precancerous polyps or small growths are found. Skin Cancer  Check your skin from head to toe regularly.  Tell your health care provider about any new moles or changes in moles, especially if there is a change in a mole's shape or color.  Also tell your health care provider if you have a mole that is larger than the size of a pencil eraser.  Always use sunscreen. Apply sunscreen liberally and repeatedly throughout the day.  Protect yourself by wearing long sleeves, pants, a wide-brimmed hat, and sunglasses whenever you are outside. Heart disease, diabetes, and high blood pressure  High blood pressure causes heart  disease and increases the risk of stroke. High blood pressure is more likely to develop in: ? People who have blood pressure in the high end of the normal range (130-139/85-89 mm Hg). ? People who are overweight or obese. ? People who are African American.  If you are 18-39 years of age, have your blood pressure checked every 3-5 years. If you are 40 years of age or older, have your blood pressure checked every year. You should have your blood pressure measured twice-once when you are at a hospital or clinic, and once when you are not at a hospital or clinic. Record the average of the two measurements. To check your blood pressure when you are not at a hospital or clinic, you can use: ? An automated blood pressure machine at a pharmacy. ? A home blood pressure monitor.  If you are between 55 years and 79 years old, ask your health care provider if you should take aspirin to prevent strokes.  Have regular diabetes screenings. This involves taking a blood sample to check your fasting blood sugar level. ? If you are at a normal weight and have a low risk for diabetes, have this test once every three years after 63 years of age. ? If you are overweight and have a high risk for diabetes, consider being tested at a younger age or more often. Preventing infection Hepatitis B  If you have a higher risk for hepatitis B, you should be screened for this virus. You are considered at high risk for hepatitis B if: ? You were born in a country where hepatitis B is common. Ask your health care provider which countries are considered high risk. ? Your parents were born in a high-risk country, and you have not been immunized against hepatitis B (hepatitis B vaccine). ? You have HIV or AIDS. ? You use needles to inject street drugs. ? You live with someone who has hepatitis B. ? You have had sex with someone who has hepatitis B. ? You get hemodialysis treatment. ? You take certain medicines for conditions,  including cancer, organ transplantation, and autoimmune conditions. Hepatitis C  Blood testing is recommended for: ? Everyone born from 1945 through 1965. ? Anyone with known risk factors for hepatitis C. Sexually transmitted infections (STIs)  You should be screened for sexually transmitted infections (STIs) including gonorrhea and chlamydia if: ? You are sexually active and are younger than 63 years of age. ? You are older than 63 years of age and your health care provider tells   you that you are at risk for this type of infection. ? Your sexual activity has changed since you were last screened and you are at an increased risk for chlamydia or gonorrhea. Ask your health care provider if you are at risk.  If you do not have HIV, but are at risk, it may be recommended that you take a prescription medicine daily to prevent HIV infection. This is called pre-exposure prophylaxis (PrEP). You are considered at risk if: ? You are sexually active and do not regularly use condoms or know the HIV status of your partner(s). ? You take drugs by injection. ? You are sexually active with a partner who has HIV. Talk with your health care provider about whether you are at high risk of being infected with HIV. If you choose to begin PrEP, you should first be tested for HIV. You should then be tested every 3 months for as long as you are taking PrEP. Pregnancy  If you are premenopausal and you may become pregnant, ask your health care provider about preconception counseling.  If you may become pregnant, take 400 to 800 micrograms (mcg) of folic acid every day.  If you want to prevent pregnancy, talk to your health care provider about birth control (contraception). Osteoporosis and menopause  Osteoporosis is a disease in which the bones lose minerals and strength with aging. This can result in serious bone fractures. Your risk for osteoporosis can be identified using a bone density scan.  If you are 65  years of age or older, or if you are at risk for osteoporosis and fractures, ask your health care provider if you should be screened.  Ask your health care provider whether you should take a calcium or vitamin D supplement to lower your risk for osteoporosis.  Menopause may have certain physical symptoms and risks.  Hormone replacement therapy may reduce some of these symptoms and risks. Talk to your health care provider about whether hormone replacement therapy is right for you. Follow these instructions at home:  Schedule regular health, dental, and eye exams.  Stay current with your immunizations.  Do not use any tobacco products including cigarettes, chewing tobacco, or electronic cigarettes.  If you are pregnant, do not drink alcohol.  If you are breastfeeding, limit how much and how often you drink alcohol.  Limit alcohol intake to no more than 1 drink per day for nonpregnant women. One drink equals 12 ounces of beer, 5 ounces of wine, or 1 ounces of hard liquor.  Do not use street drugs.  Do not share needles.  Ask your health care provider for help if you need support or information about quitting drugs.  Tell your health care provider if you often feel depressed.  Tell your health care provider if you have ever been abused or do not feel safe at home. This information is not intended to replace advice given to you by your health care provider. Make sure you discuss any questions you have with your health care provider. Document Released: 02/09/2011 Document Revised: 01/02/2016 Document Reviewed: 04/30/2015 Elsevier Interactive Patient Education  2019 Elsevier Inc.  

## 2019-01-25 NOTE — Progress Notes (Signed)
Kristin Macias DOB: 63 y.o. MRN: [id number]  Subjective:  Here for Biometric Screen/brief exam Patient is an employee of [employer], she has the Surveyor, quantity of register deeds. Reports she has a primary care provider Kristin Macias in [place], she does see her regularly. She reports that she tries to eat healthy, and exercise when she can.  She denies any health concerns at this visit. 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  Patient  denies any fever, body aches,chills, rash, chest pain, shortness of breath, nausea, vomiting, or diarrhea.  Objective: Blood pressure (!) 142/78, pulse 68, temperature 99.1 F (37.3 C), temperature source Oral, resp. rate 16, height 5\' 5"  (1.651 m), weight 136 lb (61.7 kg), SpO2 98 %. NAD Patient is alert and oriented and responsive to questions Engages in eye contact with provider. Speaks in full sentences without any pauses without any shortness of breath or distress.   HEENT: Within normal limits Neck: Normal, supple  Heart: Regular rate and rhythm Lungs: Clear  Assessment: Biometric screen   Plan: Discussed blood pressure and mild elevation,monitor morning and evening for two weeks and report to your Kristin Macias, Kristin Pih, MD  Primary care.   Fasting glucose and lipids. Discussed with patient that today's visit here is a limited biometric screening visit (not a comprehensive exam or management of any chronic problems) Discussed some health issues, including healthy eating habits and exercise. Encouraged to follow-up with PCP for annual comprehensive preventive and wellness care (and if applicable, any chronic issues). Questions invited and answered. Advised patient call the office or your primary care doctor for an appointment if no improvement within 72 hours or if any  symptoms change or worsen at any time  Advised ER or urgent Care if after hours or on weekend. Call 911 for emergency symptoms at any time.Patinet verbalized understanding of all instructions given/reviewed and treatment plan and has no further questions or concerns at this time.

## 2019-01-26 LAB — GLUCOSE, RANDOM: Glucose: 107 mg/dL — ABNORMAL HIGH (ref 65–99)

## 2019-01-26 LAB — LIPID PANEL WITH LDL/HDL RATIO
Cholesterol, Total: 246 mg/dL — ABNORMAL HIGH (ref 100–199)
HDL: 79 mg/dL (ref >39)
LDL Calculated: 148 mg/dL — ABNORMAL HIGH (ref 0–99)
LDl/HDL Ratio: 1.9 ratio (ref 0.0–3.2)
Triglycerides: 96 mg/dL (ref 0–149)
VLDL Cholesterol Cal: 19 mg/dL (ref 5–40)

## 2019-01-27 ENCOUNTER — Encounter: Payer: Self-pay | Admitting: Adult Health

## 2019-02-15 DIAGNOSIS — R739 Hyperglycemia, unspecified: Secondary | ICD-10-CM | POA: Insufficient documentation

## 2020-01-24 DIAGNOSIS — R441 Visual hallucinations: Secondary | ICD-10-CM | POA: Insufficient documentation

## 2020-02-19 NOTE — Progress Notes (Signed)
New patient visit   Patient: Kristin Macias   DOB: 1955-08-23   64 y.o. Female  MRN: [id number] Visit Date: 02/20/2020  Today's healthcare provider: Trey Sailors, PA-C   Chief Complaint  Patient presents with  . Hyperlipidemia  . Hypertension  . New Patient (Initial Visit)   Subjective    Kristin Macias is a 64 y.o. female who presents today as a new patient to establish care.    Hypertension, follow-up  BP Readings from Last 3 Encounters:  02/20/20 (!) 170/85  01/25/19 (!) 142/78  07/14/18 (!) 157/83   Wt Readings from Last 3 Encounters:  02/20/20 143 lb (64.9 kg)  01/25/19 136 lb (61.7 kg)  07/14/18 144 lb (65.3 kg)     She was last seen for hypertension 1 months ago.  BP at that visit was 142/78. Management since that visit includes no changes.  She reports good compliance with treatment. She is not having side effects.  She is following a Regular diet. She is not exercising. She does not smoke.  Use of agents associated with hypertension: none.   Outside blood pressures are not being checked. Symptoms: No chest pain No chest pressure  No palpitations No syncope  No dyspnea No orthopnea  No paroxysmal nocturnal dyspnea No lower extremity edema   Pertinent labs: Lab Results  Component Value Date   CHOL 246 (H) 01/25/2019   HDL 79 01/25/2019   LDLCALC 148 (H) 01/25/2019   TRIG 96 01/25/2019   CHOLHDL 3.4 01/04/2018   Lab Results  Component Value Date   NA 143 04/02/2017   K 4.5 04/02/2017   CREATININE 0.70 04/02/2017   GFRNONAA 94 04/02/2017   GFRAA 109 04/02/2017   GLUCOSE 107 (H) 01/25/2019     The 10-year ASCVD risk score Denman George DC Jr., et al., 2013) is: 9.8%    Lipid/Cholesterol, Follow-up  Last lipid panel Other pertinent labs  Lab Results  Component Value Date   CHOL 246 (H) 01/25/2019   HDL 79 01/25/2019   LDLCALC 148 (H) 01/25/2019   TRIG 96 01/25/2019   CHOLHDL 3.4 01/04/2018   Lab Results  Component Value Date   ALT  14 04/02/2017   AST 18 04/02/2017   PLT 248 04/02/2017   TSH 1.430 04/02/2017      Patient also mentions that she has pain in her feet when walking. Reports that the pain is worse in the mornings. Reports pain is deep in the sole of her foot. Improves throughout the day.    Past Medical History:  Diagnosis Date  . Dysrhythmia    murmur  . Heart murmur   . Hypertension    Past Surgical History:  Procedure Laterality Date  . CHOLECYSTECTOMY    . COLONOSCOPY    . COLONOSCOPY WITH PROPOFOL N/A 08/25/2017   Procedure: COLONOSCOPY WITH PROPOFOL;  Surgeon: Wyline Mood, MD;  Location: [facility] ENDOSCOPY;  Service: Gastroenterology;  Laterality: N/A;  . ESOPHAGOGASTRODUODENOSCOPY (EGD) WITH PROPOFOL N/A 08/25/2017   Procedure: ESOPHAGOGASTRODUODENOSCOPY (EGD) WITH PROPOFOL;  Surgeon: Wyline Mood, MD;  Location: [facility] ENDOSCOPY;  Service: Gastroenterology;  Laterality: N/A;  . LAPAROSCOPIC HYSTERECTOMY     DUB   Family Status  Relation Name Status  . Mother  Deceased  . Father  Alive  . Neg Hx  (Not Specified)   Family History  Problem Relation Age of Onset  . Lung cancer Mother   . Breast cancer Neg Hx   . Ovarian cancer Neg Hx  Social History   Socioeconomic History  . Marital status: Married    Spouse name: Not on file  . Number of children: Not on file  . Years of education: Not on file  . Highest education level: Not on file  Occupational History  . Not on file  Tobacco Use  . Smoking status: Former Games developer  . Smokeless tobacco: Never Used  Vaping Use  . Vaping Use: Never used  Substance and Sexual Activity  . Alcohol use: Yes    Alcohol/week: 0.0 standard drinks    Comment: occasional wine  . Drug use: No  . Sexual activity: Yes    Birth control/protection: Surgical    Comment: Hysterectomy  Other Topics Concern  . Not on file  Social History Narrative  . Not on file   Social Determinants of Health   Financial Resource Strain:   . Difficulty of Paying  Living Expenses:   Food Insecurity:   . Worried About Programme researcher, broadcasting/film/video in the Last Year:   . Barista in the Last Year:   Transportation Needs:   . Freight forwarder (Medical):   Marland Kitchen Lack of Transportation (Non-Medical):   Physical Activity:   . Days of Exercise per Week:   . Minutes of Exercise per Session:   Stress:   . Feeling of Stress :   Social Connections:   . Frequency of Communication with Friends and Family:   . Frequency of Social Gatherings with Friends and Family:   . Attends Religious Services:   . Active Member of Clubs or Organizations:   . Attends Banker Meetings:   Marland Kitchen Marital Status:    Outpatient Medications Prior to Visit  Medication Sig  . aspirin 81 MG chewable tablet Chew by mouth.  Marland Kitchen BIOTIN PO Take by mouth.  . estradiol (ESTRACE) 0.1 MG/GM vaginal cream Place 1 Applicatorful vaginally at bedtime. Insert 1g nightly for 1 wk, then 1 g once weekly as maintenace  . GARLIC PO Take by mouth.  . hydrochlorothiazide (HYDRODIURIL) 25 MG tablet Take 25 mg by mouth daily.  Marland Kitchen lisinopril (PRINIVIL,ZESTRIL) 20 MG tablet Take 20 mg by mouth.  . metoprolol succinate (TOPROL-XL) 25 MG 24 hr tablet Take 25 mg by mouth daily.  . Omega-3 1000 MG CAPS Take by mouth.  Marland Kitchen omeprazole (PRILOSEC) 40 MG capsule Take 40 mg by mouth daily.  . polyethylene glycol (MIRALAX / GLYCOLAX) packet Take 17 g by mouth daily.  . vitamin B-12 (CYANOCOBALAMIN) 1000 MCG tablet Take 1 tablet by mouth daily.  . vitamin E 1000 UNIT capsule Take by mouth. (Patient not taking: Reported on 02/20/2020)   No facility-administered medications prior to visit.   Allergies  Allergen Reactions  . Amlodipine     Other reaction(s): SWELLING/EDEMA  . Tetracycline     Other reaction(s): HIVES    Immunization History  Administered Date(s) Administered  . Influenza, Seasonal, Injecte, Preservative Fre 07/11/2007, 05/14/2008  . Influenza,inj,Quad PF,6+ Mos 05/27/2017, 05/26/2018  .  Influenza-Unspecified 05/20/2017, 05/26/2018  . Pneumococcal Polysaccharide-23 06/08/2018  . Tetanus 08/20/2006    Health Maintenance  Topic Date Due  . Hepatitis C Screening  Never done  . HIV Screening  Never done  . MAMMOGRAM  12/09/2019  . COVID-19 Vaccine (1) 03/07/2020 (Originally 07/19/1968)  . INFLUENZA VACCINE  03/10/2020  . PAP SMEAR-Modifier  04/20/2021  . COLONOSCOPY  08/25/2022  . TETANUS/TDAP  02/14/2029    Patient Care Team: Maryella Shivers as PCP -  General (Physician Assistant)  Review of Systems  Constitutional: Negative.   HENT: Negative.   Eyes: Negative.   Respiratory: Negative for cough.   Cardiovascular: Negative for chest pain, palpitations and leg swelling.  Gastrointestinal: Negative.   Endocrine: Negative.   Genitourinary: Negative.   Musculoskeletal: Negative.   Skin: Negative.   Allergic/Immunologic: Negative.   Neurological: Negative for dizziness and headaches.  Hematological: Negative.   Psychiatric/Behavioral: Negative.       Objective    BP (!) 170/85   Pulse 75   Temp 98.1 F (36.7 C)   Ht 5\' 6"  (1.676 m)   Wt 143 lb (64.9 kg)   BMI 23.08 kg/m  Physical Exam Constitutional:      Appearance: Normal appearance.  Cardiovascular:     Rate and Rhythm: Normal rate and regular rhythm.     Pulses: Normal pulses.     Heart sounds: Normal heart sounds.  Pulmonary:     Effort: Pulmonary effort is normal.     Breath sounds: Normal breath sounds.  Skin:    General: Skin is warm and dry.  Neurological:     Mental Status: She is alert and oriented to person, place, and time. Mental status is at baseline.  Psychiatric:        Mood and Affect: Mood normal.        Behavior: Behavior normal.     Depression Screen PHQ 2/9 Scores 02/23/2020  PHQ - 2 Score 0   No results found for any visits on 02/20/20.  Assessment & Plan     1. Hyperlipidemia, unspecified hyperlipidemia type  Start as below for elevated CVD risk.  -  atorvastatin (LIPITOR) 10 MG tablet; Take 1 tablet (10 mg total) by mouth daily.  Dispense: 90 tablet; Refill: 1  2. Plantar fasciitis  - Ambulatory referral to Podiatry  3. Encounter for screening mammogram for malignant neoplasm of breast   - MM Digital Screening; Future  4. Hot flashes  Discussed use of HRT, Paxil, Clonidine. She would like to start Lipitor first and then discuss next visit.  5. Hypertension  Elevated but patient reports not taking her medication today. Recheck next visit.  No follow-ups on file.     I07/15/21, PA-C, have reviewed all documentation for this visit. The documentation on 02/23/20 for the exam, diagnosis, procedures, and orders are all accurate and complete.    02/25/20  [facility] [phone] (phone) [phone] (fax)  [facility]

## 2020-02-20 ENCOUNTER — Encounter: Payer: Self-pay | Admitting: Physician Assistant

## 2020-02-20 ENCOUNTER — Other Ambulatory Visit: Payer: Self-pay

## 2020-02-20 ENCOUNTER — Ambulatory Visit (INDEPENDENT_AMBULATORY_CARE_PROVIDER_SITE_OTHER): Payer: Managed Care, Other (non HMO) | Admitting: Physician Assistant

## 2020-02-20 VITALS — BP 170/85 | HR 75 | Temp 98.1°F | Ht 66.0 in | Wt 143.0 lb

## 2020-02-20 DIAGNOSIS — R232 Flushing: Secondary | ICD-10-CM | POA: Diagnosis not present

## 2020-02-20 DIAGNOSIS — E785 Hyperlipidemia, unspecified: Secondary | ICD-10-CM | POA: Diagnosis not present

## 2020-02-20 DIAGNOSIS — M722 Plantar fascial fibromatosis: Secondary | ICD-10-CM | POA: Diagnosis not present

## 2020-02-20 DIAGNOSIS — I10 Essential (primary) hypertension: Secondary | ICD-10-CM

## 2020-02-20 DIAGNOSIS — Z1231 Encounter for screening mammogram for malignant neoplasm of breast: Secondary | ICD-10-CM

## 2020-02-20 DIAGNOSIS — F1029 Alcohol dependence with unspecified alcohol-induced disorder: Secondary | ICD-10-CM

## 2020-02-20 MED ORDER — ATORVASTATIN CALCIUM 10 MG PO TABS: 10.0000 mg | ORAL_TABLET | Freq: Every day | ORAL | 1 refills | Status: DC

## 2020-03-04 ENCOUNTER — Ambulatory Visit: Payer: Managed Care, Other (non HMO) | Admitting: Podiatry

## 2020-03-04 ENCOUNTER — Ambulatory Visit (INDEPENDENT_AMBULATORY_CARE_PROVIDER_SITE_OTHER): Payer: Managed Care, Other (non HMO)

## 2020-03-04 ENCOUNTER — Other Ambulatory Visit: Payer: Self-pay

## 2020-03-04 ENCOUNTER — Other Ambulatory Visit: Payer: Self-pay | Admitting: Podiatry

## 2020-03-04 DIAGNOSIS — M79672 Pain in left foot: Secondary | ICD-10-CM | POA: Diagnosis not present

## 2020-03-04 DIAGNOSIS — M722 Plantar fascial fibromatosis: Secondary | ICD-10-CM

## 2020-03-04 DIAGNOSIS — M79671 Pain in right foot: Secondary | ICD-10-CM | POA: Diagnosis not present

## 2020-03-05 ENCOUNTER — Encounter: Payer: Self-pay | Admitting: Podiatry

## 2020-03-05 NOTE — Progress Notes (Signed)
Subjective:  Patient ID: Kristin Macias, female    DOB: 05-Oct-1955,  MRN: [id number]  Chief Complaint  Patient presents with   Foot Pain    pt is here for bil foot pain, pt has pain on the bottom of the right heel, and back of the left heel. Pt states that the pain is elevated to the touch as well.    64 y.o. female presents with the above complaint.  Patient presents with complaint of bilateral heel pain that has been going on for quite some time.  Patient states that they have been going for a year.  Pain is worse when walking on a treadmill.  Pain is elevated to the touch.  Patient has tried inserts but not have much has helped.  She states her pain scale is 6 out of 10.  Is sharp shooting in nature.  She denies seeing anyone else for this.  She wants to know if this could be treated.  She denies any other acute complaints.   Review of Systems: Negative except as noted in the HPI. Denies N/V/F/Ch.  Past Medical History:  Diagnosis Date   Dysrhythmia    murmur   Heart murmur    Hypertension     Current Outpatient Medications:    aspirin 81 MG chewable tablet, Chew by mouth., Disp: , Rfl:    atorvastatin (LIPITOR) 10 MG tablet, Take 1 tablet (10 mg total) by mouth daily., Disp: 90 tablet, Rfl: 1   BIOTIN PO, Take by mouth., Disp: , Rfl:    estradiol (ESTRACE) 0.1 MG/GM vaginal cream, Place 1 Applicatorful vaginally at bedtime. Insert 1g nightly for 1 wk, then 1 g once weekly as maintenace, Disp: 42.5 g, Rfl: 1   GARLIC PO, Take by mouth., Disp: , Rfl:    hydrochlorothiazide (HYDRODIURIL) 25 MG tablet, Take 25 mg by mouth daily., Disp: , Rfl: 2   lisinopril (PRINIVIL,ZESTRIL) 20 MG tablet, Take 20 mg by mouth., Disp: , Rfl:    metoprolol succinate (TOPROL-XL) 25 MG 24 hr tablet, Take 25 mg by mouth daily., Disp: , Rfl:    Omega-3 1000 MG CAPS, Take by mouth., Disp: , Rfl:    omeprazole (PRILOSEC) 40 MG capsule, Take 40 mg by mouth daily., Disp: , Rfl: 3    polyethylene glycol (MIRALAX / GLYCOLAX) packet, Take 17 g by mouth daily., Disp: , Rfl:    vitamin B-12 (CYANOCOBALAMIN) 1000 MCG tablet, Take 1 tablet by mouth daily., Disp: , Rfl:    vitamin E 1000 UNIT capsule, Take by mouth. , Disp: , Rfl:   Social History   Tobacco Use  Smoking Status Former Smoker  Smokeless Tobacco Never Used    Allergies  Allergen Reactions   Amlodipine     Other reaction(s): SWELLING/EDEMA   Tetracycline     Other reaction(s): HIVES   Objective:  There were no vitals filed for this visit. There is no height or weight on file to calculate BMI. Constitutional Well developed. Well nourished.  Vascular Dorsalis pedis pulses palpable bilaterally. Posterior tibial pulses palpable bilaterally. Capillary refill normal to all digits.  No cyanosis or clubbing noted. Pedal hair growth normal.  Neurologic Normal speech. Oriented to person, place, and time. Epicritic sensation to light touch grossly present bilaterally.  Dermatologic Nails well groomed and normal in appearance. No open wounds. No skin lesions.  Orthopedic: Normal joint ROM without pain or crepitus bilaterally. No visible deformities. Tender to palpation at the calcaneal tuber bilaterally. No pain with calcaneal  squeeze bilaterally. Ankle ROM diminished range of motion bilaterally. Silfverskiold Test: positive bilaterally.   Radiographs: Taken and reviewed. No acute fractures or dislocations. No evidence of stress fracture.  Plantar heel spur absent. Posterior heel spur present.   Assessment:   1. Foot pain, bilateral    Plan:  Patient was evaluated and treated and all questions answered.  Plantar Fasciitis, bilaterally - XR reviewed as above.  - Educated on icing and stretching. Instructions given.  - Injection delivered to the plantar fascia as below. - DME: Plantar Fascial Brace x2 - Pharmacologic management: Meloxicam/Medrol Dose Pak. Educated on risks/benefits and proper  taking of medication.  Procedure: Injection Tendon/Ligament Location: Bilateral plantar fascia at the glabrous junction; medial approach. Skin Prep: alcohol Injectate: 0.5 cc 0.5% marcaine plain, 0.5 cc of 1% Lidocaine, 0.5 cc kenalog 10. Disposition: Patient tolerated procedure well. Injection site dressed with a band-aid.  No follow-ups on file.

## 2020-04-02 ENCOUNTER — Ambulatory Visit: Payer: Managed Care, Other (non HMO) | Admitting: Podiatry

## 2020-04-02 ENCOUNTER — Encounter: Payer: Self-pay | Admitting: Podiatry

## 2020-04-02 ENCOUNTER — Other Ambulatory Visit: Payer: Self-pay

## 2020-04-02 DIAGNOSIS — M79672 Pain in left foot: Secondary | ICD-10-CM

## 2020-04-02 DIAGNOSIS — M722 Plantar fascial fibromatosis: Secondary | ICD-10-CM

## 2020-04-02 DIAGNOSIS — M79671 Pain in right foot: Secondary | ICD-10-CM | POA: Diagnosis not present

## 2020-04-02 NOTE — Progress Notes (Signed)
Subjective:  Patient ID: Kristin Macias, female    DOB: Dec 20, 1955,  MRN: [id number]  Chief Complaint  Patient presents with  . Foot Pain    "my right foot is 100 percent better and my left one is about 90 percent better"    64 y.o. female presents with the above complaint.  Patient presents for follow-up bilateral plantar fasciitis.  Patient states that she is doing well.  Injection helped as well as bracing help.  She does not have any pain she is 100% improved.  She denies any other acute complaints.   Review of Systems: Negative except as noted in the HPI. Denies N/V/F/Ch.  Past Medical History:  Diagnosis Date  . Dysrhythmia    murmur  . Heart murmur   . Hypertension     Current Outpatient Medications:  .  aspirin 81 MG chewable tablet, Chew by mouth., Disp: , Rfl:  .  atorvastatin (LIPITOR) 10 MG tablet, Take 1 tablet (10 mg total) by mouth daily., Disp: 90 tablet, Rfl: 1 .  BIOTIN PO, Take by mouth., Disp: , Rfl:  .  estradiol (ESTRACE) 0.1 MG/GM vaginal cream, Place 1 Applicatorful vaginally at bedtime. Insert 1g nightly for 1 wk, then 1 g once weekly as maintenace, Disp: 42.5 g, Rfl: 1 .  GARLIC PO, Take by mouth., Disp: , Rfl:  .  hydrochlorothiazide (HYDRODIURIL) 25 MG tablet, Take 25 mg by mouth daily., Disp: , Rfl: 2 .  lisinopril (PRINIVIL,ZESTRIL) 20 MG tablet, Take 20 mg by mouth., Disp: , Rfl:  .  metoprolol succinate (TOPROL-XL) 25 MG 24 hr tablet, Take 25 mg by mouth daily., Disp: , Rfl:  .  Omega-3 1000 MG CAPS, Take by mouth., Disp: , Rfl:  .  omeprazole (PRILOSEC) 40 MG capsule, Take 40 mg by mouth daily., Disp: , Rfl: 3 .  polyethylene glycol (MIRALAX / GLYCOLAX) packet, Take 17 g by mouth daily., Disp: , Rfl:  .  vitamin B-12 (CYANOCOBALAMIN) 1000 MCG tablet, Take 1 tablet by mouth daily., Disp: , Rfl:  .  vitamin E 1000 UNIT capsule, Take by mouth. , Disp: , Rfl:   Social History   Tobacco Use  Smoking Status Former Smoker  Smokeless Tobacco Never  Used    Allergies  Allergen Reactions  . Amlodipine     Other reaction(s): SWELLING/EDEMA  . Tetracycline     Other reaction(s): HIVES   Objective:  There were no vitals filed for this visit. There is no height or weight on file to calculate BMI. Constitutional Well developed. Well nourished.  Vascular Dorsalis pedis pulses palpable bilaterally. Posterior tibial pulses palpable bilaterally. Capillary refill normal to all digits.  No cyanosis or clubbing noted. Pedal hair growth normal.  Neurologic Normal speech. Oriented to person, place, and time. Epicritic sensation to light touch grossly present bilaterally.  Dermatologic Nails well groomed and normal in appearance. No open wounds. No skin lesions.  Orthopedic: Normal joint ROM without pain or crepitus bilaterally. No visible deformities. Now tender to palpation at the calcaneal tuber bilaterally. No pain with calcaneal squeeze bilaterally. Ankle ROM diminished range of motion bilaterally. Silfverskiold Test: positive bilaterally.   Radiographs: Taken and reviewed. No acute fractures or dislocations. No evidence of stress fracture.  Plantar heel spur absent. Posterior heel spur present.   Assessment:   1. Plantar fasciitis of right foot   2. Plantar fasciitis of left foot   3. Foot pain, bilateral    Plan:  Patient was evaluated and treated and  all questions answered.  Plantar Fasciitis, bilaterally -Clinically healed.  Continue using brace as needed.  Patient will start transitioning into regular sneakers without a brace.  I discussed this with the patient extensively detail about breaking into new shoes.  I discussed shoe gear modification with her as well.If it -If any foot and ankle issues arise in the future including recurrence of plantar fasciitis I will discuss orthotics management as well as further steroid injection.  Patient agrees with the plan.   No follow-ups on file.

## 2020-04-03 ENCOUNTER — Ambulatory Visit: Payer: Self-pay | Admitting: Physician Assistant

## 2020-04-08 ENCOUNTER — Ambulatory Visit: Payer: Managed Care, Other (non HMO) | Admitting: Physician Assistant

## 2020-04-08 NOTE — Progress Notes (Signed)
Established patient visit   Patient: Kristin Macias   DOB: 1956/03/15   64 y.o. Female  MRN: [id number] Visit Date: 04/09/2020  Today's healthcare provider: Trey Sailors, PA-C   Chief Complaint  Patient presents with  . Hyperlipidemia   Subjective    HPI  Lipid/Cholesterol, Follow-up  Last lipid panel Other pertinent labs  Lab Results  Component Value Date   CHOL 246 (H) 01/25/2019   HDL 79 01/25/2019   LDLCALC 148 (H) 01/25/2019   TRIG 96 01/25/2019   CHOLHDL 3.4 01/04/2018   Lab Results  Component Value Date   ALT 14 04/02/2017   AST 18 04/02/2017   PLT 248 04/02/2017   TSH 1.430 04/02/2017     She was last seen for this 6 weeks ago.  Management since that visit includes starting atorvastatin 10mg  daily.  She reports good compliance with treatment.  She is not having side effects.   Symptoms: No chest pain No chest pressure/discomfort  No dyspnea No lower extremity edema  No numbness or tingling of extremity No orthopnea  No palpitations No paroxysmal nocturnal dyspnea  No speech difficulty No syncope   Current diet: well balanced Current exercise: none  The 10-year ASCVD risk score DC Jr., et al., 2013) is: 5.2%  Wt Readings from Last 3 Encounters:  04/09/20 129 lb 9.6 oz (58.8 kg)  02/20/20 143 lb (64.9 kg)  01/25/19 136 lb (61.7 kg)         Medications: Outpatient Medications Prior to Visit  Medication Sig  . aspirin 81 MG chewable tablet Chew by mouth.  01/27/19 atorvastatin (LIPITOR) 10 MG tablet Take 1 tablet (10 mg total) by mouth daily.  Marland Kitchen GARLIC PO Take by mouth.  . hydrochlorothiazide (HYDRODIURIL) 25 MG tablet Take 25 mg by mouth daily.  Marland Kitchen lisinopril (PRINIVIL,ZESTRIL) 20 MG tablet Take 20 mg by mouth.  . metoprolol succinate (TOPROL-XL) 25 MG 24 hr tablet Take 25 mg by mouth daily.  Marland Kitchen omeprazole (PRILOSEC) 40 MG capsule Take 40 mg by mouth daily.  . polyethylene glycol (MIRALAX / GLYCOLAX) packet Take 17 g by mouth daily.   . vitamin B-12 (CYANOCOBALAMIN) 1000 MCG tablet Take 1 tablet by mouth daily.  . vitamin E 1000 UNIT capsule Take by mouth.   Marland Kitchen BIOTIN PO Take by mouth.  . estradiol (ESTRACE) 0.1 MG/GM vaginal cream Place 1 Applicatorful vaginally at bedtime. Insert 1g nightly for 1 wk, then 1 g once weekly as maintenace (Patient not taking: Reported on 04/09/2020)  . Omega-3 1000 MG CAPS Take by mouth. (Patient not taking: Reported on 04/09/2020)   No facility-administered medications prior to visit.    Review of Systems  Constitutional: Negative.   Respiratory: Negative.   Cardiovascular: Negative.   Musculoskeletal: Negative.   Neurological: Negative.       Objective    BP 123/66   Pulse 67   Temp 98.9 F (37.2 C)   Ht 5\' 5"  (1.651 m)   Wt 129 lb 9.6 oz (58.8 kg)   BMI 21.57 kg/m    Physical Exam Constitutional:      Appearance: Normal appearance.  Cardiovascular:     Rate and Rhythm: Normal rate and regular rhythm.     Pulses: Normal pulses.     Heart sounds: Normal heart sounds.  Pulmonary:     Effort: Pulmonary effort is normal.     Breath sounds: Normal breath sounds.  Skin:    General: Skin is warm and  dry.  Neurological:     Mental Status: She is alert and oriented to person, place, and time. Mental status is at baseline.  Psychiatric:        Mood and Affect: Mood normal.        Behavior: Behavior normal.       No results found for any visits on 04/09/20.  Assessment & Plan    1. Hyperlipidemia, unspecified hyperlipidemia type  Check labs, adjust pending results. Reminded to schedule mammo, this has been ordered. She has lost about 5 lbs, weight was entered incorrectly in last visit. She was 134 lbs last visit. F/u pending lab results.   - Lipid Profile  2. Need for influenza vaccination  - Flu Vaccine QUAD 6+ mos PF IM (Fluarix Quad PF)     No follow-ups on file.      ITrey Sailors, PA-C, have reviewed all documentation for this visit. The  documentation on 04/09/20 for the exam, diagnosis, procedures, and orders are all accurate and complete.  The entirety of the information documented in the History of Present Illness, Review of Systems and Physical Exam were personally obtained by me. Portions of this information were initially documented by Anson [place], CMA and reviewed by me for thoroughness and accuracy.     Maryella Shivers  [facility] [phone] (phone) [phone] (fax)  [facility]

## 2020-04-09 ENCOUNTER — Other Ambulatory Visit: Payer: Self-pay

## 2020-04-09 ENCOUNTER — Encounter: Payer: Self-pay | Admitting: Physician Assistant

## 2020-04-09 ENCOUNTER — Ambulatory Visit (INDEPENDENT_AMBULATORY_CARE_PROVIDER_SITE_OTHER): Payer: Managed Care, Other (non HMO) | Admitting: Physician Assistant

## 2020-04-09 VITALS — BP 123/66 | HR 67 | Temp 98.9°F | Ht 65.0 in | Wt 129.6 lb

## 2020-04-09 DIAGNOSIS — E785 Hyperlipidemia, unspecified: Secondary | ICD-10-CM | POA: Diagnosis not present

## 2020-04-09 DIAGNOSIS — Z23 Encounter for immunization: Secondary | ICD-10-CM

## 2020-04-09 NOTE — Patient Instructions (Signed)
Please schedule mammogram with [facility]

## 2020-04-10 LAB — LIPID PANEL
Chol/HDL Ratio: 2.5 ratio (ref 0.0–4.4)
Cholesterol, Total: 194 mg/dL (ref 100–199)
HDL: 78 mg/dL (ref >39)
LDL Chol Calc (NIH): 100 mg/dL — ABNORMAL HIGH (ref 0–99)
Triglycerides: 93 mg/dL (ref 0–149)
VLDL Cholesterol Cal: 16 mg/dL (ref 5–40)

## 2020-06-12 ENCOUNTER — Other Ambulatory Visit: Payer: Self-pay | Admitting: Physician Assistant

## 2020-06-12 NOTE — Telephone Encounter (Signed)
Requested medication (s) are due for refill today: no  Requested medication (s) are on the active medication list: no  Last refill:  06/22/18 #90 with 3 refills  Future visit scheduled: yes  Notes to clinic:  Please review for refill. Medication previously ordered by a different provider    Requested Prescriptions  Pending Prescriptions Disp Refills   omeprazole (PRILOSEC) 40 MG capsule [Pharmacy Med Name: omeprazole 40 mg capsule,delayed release] 90 capsule 3    Sig: TAKE ONE CAPSULE BY MOUTH ONCE DAILY      Gastroenterology: Proton Pump Inhibitors Passed - 06/12/2020  3:00 PM      Passed - Valid encounter within last 12 months    Recent Outpatient Visits           2 months ago Hyperlipidemia, unspecified hyperlipidemia type   [facility] [place], Ricki Rodriguez M, PA-C   3 months ago Hyperlipidemia, unspecified hyperlipidemia type   [facility] [place], Lavella Hammock, [place]       Future Appointments             In 10 months Trey Sailors, PA-C [facility]

## 2020-06-12 NOTE — Telephone Encounter (Signed)
Message from [facility] nurse:  Notes to clinic:  Please review for refill. Medication previously ordered by a different provider

## 2020-07-09 ENCOUNTER — Other Ambulatory Visit: Payer: Self-pay | Admitting: Physician Assistant

## 2020-07-09 DIAGNOSIS — E785 Hyperlipidemia, unspecified: Secondary | ICD-10-CM

## 2020-10-18 ENCOUNTER — Other Ambulatory Visit: Payer: Self-pay

## 2020-10-18 ENCOUNTER — Ambulatory Visit: Payer: Managed Care, Other (non HMO) | Admitting: Physician Assistant

## 2020-10-18 VITALS — BP 136/67 | HR 67 | Temp 97.9°F | Ht 65.0 in | Wt 131.0 lb

## 2020-10-18 DIAGNOSIS — Z1159 Encounter for screening for other viral diseases: Secondary | ICD-10-CM | POA: Diagnosis not present

## 2020-10-18 DIAGNOSIS — E785 Hyperlipidemia, unspecified: Secondary | ICD-10-CM

## 2020-10-18 DIAGNOSIS — I10 Essential (primary) hypertension: Secondary | ICD-10-CM

## 2020-10-18 DIAGNOSIS — F1029 Alcohol dependence with unspecified alcohol-induced disorder: Secondary | ICD-10-CM

## 2020-10-18 DIAGNOSIS — Z114 Encounter for screening for human immunodeficiency virus [HIV]: Secondary | ICD-10-CM

## 2020-10-18 MED ORDER — HYDROCHLOROTHIAZIDE 25 MG PO TABS: 25.0000 mg | ORAL_TABLET | Freq: Every day | ORAL | 1 refills | Status: AC

## 2020-10-18 MED ORDER — LISINOPRIL 20 MG PO TABS: 20.0000 mg | ORAL_TABLET | Freq: Every day | ORAL | 1 refills | Status: AC

## 2020-10-18 MED ORDER — ATORVASTATIN CALCIUM 10 MG PO TABS
10.0000 mg | ORAL_TABLET | Freq: Every day | ORAL | 1 refills | Status: DC
Start: 2020-10-18 — End: 2022-01-14

## 2020-10-18 MED ORDER — METOPROLOL SUCCINATE ER 25 MG PO TB24: 25.0000 mg | ORAL_TABLET | Freq: Every day | ORAL | 1 refills | Status: AC

## 2020-10-18 NOTE — Progress Notes (Signed)
Established patient visit   Patient: Kristin Macias   DOB: 01-12-1956   65 y.o. Female  MRN: [id number] Visit Date: 10/18/2020  Today's healthcare provider: Trey Sailors, PA-C   Chief Complaint  Patient presents with  . Hyperlipidemia   Subjective    HPI  Lipid/Cholesterol, Follow-up  Last lipid panel Other pertinent labs  Lab Results  Component Value Date   CHOL 194 04/09/2020   HDL 78 04/09/2020   LDLCALC 100 (H) 04/09/2020   TRIG 93 04/09/2020   CHOLHDL 2.5 04/09/2020   Lab Results  Component Value Date   ALT 14 04/02/2017   AST 18 04/02/2017   PLT 248 04/02/2017   TSH 1.430 04/02/2017     She was last seen for this 7 months ago.  Management since that visit includes continue current medications.  She reports excellent compliance with treatment. She is having side effects. 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  BP at that visit was normal. Management since that visit includes continue medications.  She reports excellent compliance with treatment. She is not having side effects.  She is following a Regular diet. She is exercising. She does not smoke.  Use  of agents associated with hypertension: none.   Outside blood pressures are not checked. Symptoms: No chest pain No chest pressure  No palpitations No syncope  No dyspnea No orthopnea  No paroxysmal nocturnal dyspnea No lower extremity edema   Pertinent labs: Lab Results  Component Value Date   CHOL 194 04/09/2020   HDL 78 04/09/2020   LDLCALC 100 (H) 04/09/2020   TRIG 93 04/09/2020   CHOLHDL 2.5 04/09/2020   Lab Results  Component Value Date   NA 143 04/02/2017   K 4.5 04/02/2017   CREATININE 0.70 04/02/2017   GFRNONAA 94 04/02/2017   GFRAA 109 04/02/2017   GLUCOSE 107 (H) 01/25/2019     The 10-year ASCVD risk score Denman George DC Jr., et al., 2013) is: 6.3%   --------------------------------------------------------------------------------------------------- Patient reports she wants her liver checked, having some RUQ pain. Drinking beers nightly, previously drank liquor and transitioned to wine and then beer.     Medications: Outpatient Medications Prior to Visit  Medication Sig  . aspirin 81 MG chewable tablet Chew by mouth.  Marland Kitchen BIOTIN PO Take by mouth.  . estradiol (ESTRACE) 0.1 MG/GM vaginal cream Place 1 Applicatorful vaginally at bedtime. Insert 1g nightly for 1 wk, then 1 g once weekly as maintenace  . GARLIC PO Take by mouth.  Marland Kitchen omeprazole (PRILOSEC) 40 MG capsule TAKE ONE CAPSULE BY MOUTH ONCE DAILY  . polyethylene glycol (MIRALAX / GLYCOLAX) packet Take 17 g by mouth daily.  . vitamin B-12 (CYANOCOBALAMIN) 1000 MCG tablet Take  1 tablet by mouth daily.  . vitamin E 1000 UNIT capsule Take by mouth.   . [DISCONTINUED] atorvastatin (LIPITOR) 10 MG tablet TAKE ONE TABLET BY MOUTH ONCE DAILY  . [DISCONTINUED] hydrochlorothiazide (HYDRODIURIL) 25 MG tablet Take 25 mg by mouth daily.  . [DISCONTINUED] lisinopril (PRINIVIL,ZESTRIL) 20 MG tablet Take 20 mg by mouth.  . [DISCONTINUED] metoprolol succinate (TOPROL-XL) 25 MG 24 hr tablet Take 25 mg by mouth daily.  .  [DISCONTINUED] Omega-3 1000 MG CAPS Take by mouth. (Patient not taking: No sig reported)   No facility-administered medications prior to visit.    Review of Systems     Objective    BP 136/67 (BP Location: Left Arm, Patient Position: Sitting, Cuff Size: Normal)   Pulse 67   Temp 97.9 F (36.6 C) (Oral)   Ht 5\' 5"  (1.651 m)   Wt 131 lb (59.4 kg)   SpO2 100%   BMI 21.80 kg/m     Physical Exam Constitutional:      Appearance: Normal appearance.  Cardiovascular:     Rate and Rhythm: Normal rate and regular rhythm.     Heart sounds: Normal heart sounds.  Pulmonary:     Effort: Pulmonary effort is normal.     Breath sounds: Normal breath sounds.  Skin:    General: Skin is warm and dry.  Neurological:     Mental Status: She is alert and oriented to person, place, and time. Mental status is at baseline.  Psychiatric:        Mood and Affect: Mood normal.        Behavior: Behavior normal.       No results found for any visits on 10/18/20.  Assessment & Plan    1. Hyperlipidemia, unspecified hyperlipidemia type  Continue lipitor, recheck lipids.   - Comprehensive Metabolic Panel (CMET) - Lipid Profile - atorvastatin (LIPITOR) 10 MG tablet; Take 1 tablet (10 mg total) by mouth daily.  Dispense: 90 tablet; Refill: 1  2. Essential (primary) hypertension  Continue medications, labs as below.   - Comprehensive Metabolic Panel (CMET) - Lipid Profile - lisinopril (ZESTRIL) 20 MG tablet; Take 1 tablet (20 mg total) by mouth daily.  Dispense: 90 tablet; Refill: 1 - hydrochlorothiazide (HYDRODIURIL) 25 MG tablet; Take 1 tablet (25 mg total) by mouth daily.  Dispense: 90 tablet; Refill: 1 - metoprolol succinate (TOPROL-XL) 25 MG 24 hr tablet; Take 1 tablet (25 mg total) by mouth daily.  Dispense: 90 tablet; Refill: 1  3. Alcohol dependence with unspecified alcohol-induced disorder (HCC)   4. Encounter for hepatitis C screening test for low risk patient  - Hepatitis C  Antibody  5. Encounter for screening for HIV  - HIV antibody (with reflex)      I, 12/18/20, PA-C, have reviewed all documentation for this visit. The documentation on 10/18/20 for the exam, diagnosis, procedures, and orders are all accurate and complete.  The entirety of the information documented in the History of Present Illness, Review of Systems and Physical Exam were personally obtained by me. Portions of this information were initially documented by 12/18/20, CMA and reviewed by me for thoroughness and accuracy.    F/u 4 months CPE    Paticia Stack  [facility] [phone] (phone) [phone] (fax)  [facility]

## 2020-10-18 NOTE — Patient Instructions (Signed)
High Cholesterol  High cholesterol is a condition in which the blood has high levels of a white, waxy substance similar to fat (cholesterol). The liver makes all the cholesterol that the body needs. The human body needs small amounts of cholesterol to help build cells. A person gets extra or excess cholesterol from the food that he or she eats. The blood carries cholesterol from the liver to the rest of the body. If you have high cholesterol, deposits (plaques) may build up on the walls of your arteries. Arteries are the blood vessels that carry blood away from your heart. These plaques make the arteries narrow and stiff. Cholesterol plaques increase your risk for heart attack and stroke. Work with your health care provider to keep your cholesterol levels in a healthy range. What increases the risk? The following factors may make you more likely to develop this condition:  Eating foods that are high in animal fat (saturated fat) or cholesterol.  Being overweight.  Not getting enough exercise.  A family history of high cholesterol (familial hypercholesterolemia).  Use of tobacco products.  Having diabetes. What are the signs or symptoms? There are no symptoms of this condition. How is this diagnosed? This condition may be diagnosed based on the results of a blood test.  If you are older than 65 years of age, your health care provider may check your cholesterol levels every 4-6 years.  You may be checked more often if you have high cholesterol or other risk factors for heart disease. The blood test for cholesterol measures:  "Bad" cholesterol, or LDL cholesterol. This is the main type of cholesterol that causes heart disease. The desired level is less than 100 mg/dL.  "Good" cholesterol, or HDL cholesterol. HDL helps protect against heart disease by cleaning the arteries and carrying the LDL to the liver for processing. The desired level for HDL is 60 mg/dL or higher.  Triglycerides.  These are fats that your body can store or burn for energy. The desired level is less than 150 mg/dL.  Total cholesterol. This measures the total amount of cholesterol in your blood and includes LDL, HDL, and triglycerides. The desired level is less than 200 mg/dL. How is this treated? This condition may be treated with:  Diet changes. You may be asked to eat foods that have more fiber and less saturated fats or added sugar.  Lifestyle changes. These may include regular exercise, maintaining a healthy weight, and quitting use of tobacco products.  Medicines. These are given when diet and lifestyle changes have not worked. You may be prescribed a statin medicine to help lower your cholesterol levels. Follow these instructions at home: Eating and drinking  Eat a healthy, balanced diet. This diet includes: ? Daily servings of a variety of fresh, frozen, or canned fruits and vegetables. ? Daily servings of whole grain foods that are rich in fiber. ? Foods that are low in saturated fats and trans fats. These include poultry and fish without skin, lean cuts of meat, and low-fat dairy products. ? A variety of fish, especially oily fish that contain omega-3 fatty acids. Aim to eat fish at least 2 times a week.  Avoid foods and drinks that have added sugar.  Use healthy cooking methods, such as roasting, grilling, broiling, baking, poaching, steaming, and stir-frying. Do not fry your food except for stir-frying.   Lifestyle  Get regular exercise. Aim to exercise for a total of 150 minutes a week. Increase your activity level by doing activities   such as gardening, walking, and taking the stairs.  Do not use any products that contain nicotine or tobacco, such as cigarettes, e-cigarettes, and chewing tobacco. If you need help quitting, ask your health care provider.   General instructions  Take over-the-counter and prescription medicines only as told by your health care provider.  Keep all  follow-up visits as told by your health care provider. This is important. Where to find more information  American Heart Association: www.heart.org  National Heart, Lung, and Blood Institute: www.nhlbi.nih.gov Contact a health care provider if:  You have trouble achieving or maintaining a healthy diet or weight.  You are starting an exercise program.  You are unable to stop smoking. Get help right away if:  You have chest pain.  You have trouble breathing.  You have any symptoms of a stroke. "BE FAST" is an easy way to remember the main warning signs of a stroke: ? B - Balance. Signs are dizziness, sudden trouble walking, or loss of balance. ? E - Eyes. Signs are trouble seeing or a sudden change in vision. ? F - Face. Signs are sudden weakness or numbness of the face, or the face or eyelid drooping on one side. ? A - Arms. Signs are weakness or numbness in an arm. This happens suddenly and usually on one side of the body. ? S - Speech. Signs are sudden trouble speaking, slurred speech, or trouble understanding what people say. ? T - Time. Time to call emergency services. Write down what time symptoms started.  You have other signs of a stroke, such as: ? A sudden, severe headache with no known cause. ? Nausea or vomiting. ? Seizure. These symptoms may represent a serious problem that is an emergency. Do not wait to see if the symptoms will go away. Get medical help right away. Call your local emergency services (911 in the [place] Do not drive yourself to the hospital. Summary  Cholesterol plaques increase your risk for heart attack and stroke. Work with your health care provider to keep your cholesterol levels in a healthy range.  Eat a healthy, balanced diet, get regular exercise, and maintain a healthy weight.  Do not use any products that contain nicotine or tobacco, such as cigarettes, e-cigarettes, and chewing tobacco.  Get help right away if you have any symptoms of a  stroke. This information is not intended to replace advice given to you by your health care provider. Make sure you discuss any questions you have with your health care provider. Document Revised: 06/26/2019 Document Reviewed: 06/26/2019 Elsevier Patient Education  2021 Elsevier Inc.  

## 2020-10-19 LAB — COMPREHENSIVE METABOLIC PANEL
ALT: 19 IU/L (ref 0–32)
AST: 23 IU/L (ref 0–40)
Albumin/Globulin Ratio: 2.4 — ABNORMAL HIGH (ref 1.2–2.2)
Albumin: 4.7 g/dL (ref 3.8–4.8)
Alkaline Phosphatase: 68 IU/L (ref 44–121)
BUN/Creatinine Ratio: 21 (ref 12–28)
BUN: 16 mg/dL (ref 8–27)
Bilirubin Total: 0.5 mg/dL (ref 0.0–1.2)
CO2: 23 mmol/L (ref 20–29)
Calcium: 9.8 mg/dL (ref 8.7–10.3)
Chloride: 101 mmol/L (ref 96–106)
Creatinine, Ser: 0.76 mg/dL (ref 0.57–1.00)
Globulin, Total: 2 g/dL (ref 1.5–4.5)
Glucose: 97 mg/dL (ref 65–99)
Potassium: 4.2 mmol/L (ref 3.5–5.2)
Sodium: 139 mmol/L (ref 134–144)
Total Protein: 6.7 g/dL (ref 6.0–8.5)
eGFR: 87 mL/min/{1.73_m2} (ref >59)

## 2020-10-19 LAB — LIPID PANEL
Chol/HDL Ratio: 2.8 ratio (ref 0.0–4.4)
Cholesterol, Total: 182 mg/dL (ref 100–199)
HDL: 64 mg/dL (ref >39)
LDL Chol Calc (NIH): 105 mg/dL — ABNORMAL HIGH (ref 0–99)
Triglycerides: 67 mg/dL (ref 0–149)
VLDL Cholesterol Cal: 13 mg/dL (ref 5–40)

## 2020-10-19 LAB — HEPATITIS C ANTIBODY: Hep C Virus Ab: <0.1 s/co ratio (ref 0.0–0.9)

## 2020-10-19 LAB — HIV ANTIBODY (ROUTINE TESTING W REFLEX): HIV Screen 4th Generation wRfx: NONREACTIVE

## 2021-01-27 ENCOUNTER — Ambulatory Visit: Payer: Self-pay | Admitting: Family Medicine

## 2021-03-24 ENCOUNTER — Encounter: Payer: Self-pay | Admitting: Gastroenterology

## 2021-03-24 ENCOUNTER — Other Ambulatory Visit: Payer: Self-pay

## 2021-03-24 ENCOUNTER — Ambulatory Visit: Payer: Managed Care, Other (non HMO) | Admitting: Gastroenterology

## 2021-03-24 VITALS — BP 185/81 | HR 69 | Temp 98.2°F | Ht 65.0 in | Wt 131.0 lb

## 2021-03-24 DIAGNOSIS — K5900 Constipation, unspecified: Secondary | ICD-10-CM | POA: Diagnosis not present

## 2021-03-24 DIAGNOSIS — R140 Abdominal distension (gaseous): Secondary | ICD-10-CM

## 2021-03-24 NOTE — Patient Instructions (Addendum)
Please give [place] a call if Linzess 145 mcg works for you so Dr. Tobi Bastos could write you a prescription.Low-FODMAP Eating Plan  FODMAP stands for fermentable oligosaccharides, disaccharides, monosaccharides, and polyols. These are sugars that are hard for some people to digest. A low-FODMAP eating plan may help some people who have irritable bowel syndrome (IBS) and certain other bowel (intestinal) diseases to manage their symptoms. This meal plan can be complicated to follow. Work with a diet and nutrition specialist (dietitian) to make a low-FODMAP eating plan that is right for you. A dietitian can helpmake sure that you get enough nutrition from this diet. What are tips for following this plan? Reading food labels Check labels for hidden FODMAPs such as: High-fructose syrup. Honey. Agave. Natural fruit flavors. Onion or garlic powder. Choose low-FODMAP foods that contain 3-4 grams of fiber per serving. Check food labels for serving sizes. Eat only one serving at a time to make sure FODMAP levels stay low. Shopping Shop with a list of foods that are recommended on this diet and make a meal plan. Meal planning Follow a low-FODMAP eating plan for up to 6 weeks, or as told by your health care provider or dietitian. To follow the eating plan: Eliminate high-FODMAP foods from your diet completely. Choose only low-FODMAP foods to eat. You will do this for 2-6 weeks. Gradually reintroduce high-FODMAP foods into your diet one at a time. Most people should wait a few days before introducing the next new high-FODMAP food into their meal plan. Your dietitian can recommend how quickly you may reintroduce foods. Keep a daily record of what and how much you eat and drink. Make note of any symptoms that you have after eating. Review your daily record with a dietitian regularly to identify which foods you can eat and which foods you should avoid. General tips Drink enough fluid each day to keep your urine pale  yellow. Avoid processed foods. These often have added sugar and may be high in FODMAPs. Avoid most dairy products, whole grains, and sweeteners. Work with a dietitian to make sure you get enough fiber in your diet. Avoid high FODMAP foods at meals to manage symptoms. Recommended foods Fruits Bananas, oranges, tangerines, lemons, limes, blueberries, raspberries, strawberries, grapes, cantaloupe, honeydew melon, kiwi, papaya, passion fruit, and pineapple. Limited amounts of dried cranberries, banana chips, and shreddedcoconut. Vegetables Eggplant, zucchini, cucumber, peppers, green beans, bean sprouts, lettuce, arugula, kale, Swiss chard, spinach, collard greens, bok choy, summer squash, potato, and tomato. Limited amounts of corn, carrot, and sweet potato. Greenparts of scallions. Grains Gluten-free grains, such as rice, oats, buckwheat, quinoa, corn, polenta, andmillet. Gluten-free pasta, bread, or cereal. Rice noodles. Corn tortillas. Meats and other proteins Unseasoned beef, pork, poultry, or fish. Eggs. Tomasa Blase. Tofu (firm) and tempeh. Limited amounts of nuts and seeds, such as almonds, walnuts, [place] nuts,pecans, peanuts, nut butters, pumpkin seeds, chia seeds, and sunflower seeds. Dairy Lactose-free milk, yogurt, and kefir. Lactose-free cottage cheese and ice cream. Non-dairy milks, such as almond, coconut, hemp, and rice milk. Non-dairy yogurt. Limited amounts of goat cheese, brie, mozzarella, parmesan, swiss, andother hard cheeses. Fats and oils Butter-free spreads. Vegetable oils, such as olive, canola, and sunflower oil. Seasoning and other foods Artificial sweeteners with names that do not end in "ol," such as aspartame, saccharine, and stevia. Maple syrup, white table sugar, raw sugar, brown sugar, and molasses. Mayonnaise, soy sauce, and tamari. Fresh basil, coriander,parsley, rosemary, and thyme. Beverages Water and mineral water. Sugar-sweetened soft drinks. Small amounts of  orangejuice or cranberry juice. Black and green tea. Most dry wines. Coffee. The items listed above may not be a complete list of foods and beverages you can eat. Contact a dietitian for more information. Foods to avoid Fruits Fresh, dried, and juiced forms of apple, pear, watermelon, peach, plum, cherries, apricots, blackberries, boysenberries, figs, nectarines, and mango.Avocado. Vegetables Chicory root, artichoke, asparagus, cabbage, snow peas, Brussels sprouts, broccoli, sugar snap peas, mushrooms, celery, and cauliflower. Onions, garlic,leeks, and the white part of scallions. Grains Wheat, including kamut, durum, and semolina. Barley and bulgur. Couscous.Wheat-based cereals. Wheat noodles, bread, crackers, and pastries. Meats and other proteins Fried or fatty meat. Sausage. Cashews and pistachios. Soybeans, baked beans, black beans, chickpeas, kidney beans, fava beans, navy beans, lentils,black-eyed peas, and split peas. Dairy Milk, yogurt, ice cream, and soft cheese. Cream and sour cream. Milk-basedsauces. Custard. Buttermilk. Soy milk. Seasoning and other foods Any sugar-free gum or candy. Foods that contain artificial sweeteners such as sorbitol, mannitol, isomalt, or xylitol. Foods that contain honey, high-fructose corn syrup, or agave. Bouillon, vegetable stock, beef stock, and chicken stock. Garlic and onion powder. Condiments made with onion, such ashummus, chutney, pickles, relish, salad dressing, and salsa. Tomato paste. Beverages Chicory-based drinks. Coffee substitutes. Chamomile tea. Fennel tea. Sweet or fortified wines such as port or sherry. Diet soft drinks made with isomalt, mannitol, maltitol, sorbitol, or xylitol. Apple, pear, and mango juice. Juiceswith high-fructose corn syrup. The items listed above may not be a complete list of foods and beverages you should avoid. Contact a dietitian for more information. Summary FODMAP stands for fermentable oligosaccharides,  disaccharides, monosaccharides, and polyols. These are sugars that are hard for some people to digest. A low-FODMAP eating plan is a short-term diet that helps to ease symptoms of certain bowel diseases. The eating plan usually lasts up to 6 weeks. After that, high-FODMAP foods are reintroduced gradually and one at a time. This can help you find out which foods may be causing symptoms. A low-FODMAP eating plan can be complicated. It is best to work with a dietitian who has experience with this type of plan. This information is not intended to replace advice given to you by your health care provider. Make sure you discuss any questions you have with your healthcare provider. Document Revised: 12/14/2019 Document Reviewed: 12/14/2019 Elsevier Patient Education  [place].

## 2021-03-24 NOTE — Progress Notes (Signed)
Wyline Mood MD, MRCP(U.K) [address]  [address]  [place], [place] [address]  Main: [phone]  Fax: [phone]   Primary Care Physician: Trey Sailors, PA-C  Primary Gastroenterologist:  Dr. Wyline Mood   C/c : GERD   HPI: Kristin Macias is a 66 y.o. female  Summary of history :   She is here today to follow up for dyspepsia She was initially seen on 08/18/17 for reflux about a year in duration. On omeprazole 40 mg once a day,zantac  , daily bowel movements. My impression was she was consuming artifical sugars which maybe causing a lot of gas and associated pain    08/25/17- colonoscopy- diverticulosis otherwise normal , EGD -normal . H pylori stool antigen was negative. Gastric biopsies showed mild reactive gastropathy.     Interval history 07/14/18 -03/24/2021   Gained 3 lbs . Says her reflux causes her to have symptoms occasionally , every few weeks .While on zantac was find but stopped after recent media reports of concerns.    Since her last visit she has said she has had episodes on and off for further bloating.  Has 3-4 bowel movements per week.  She has noticed that possibly when she has not had a bowel movement for a while she has more bloating and abdominal distention.  She takes MiraLAX once a day.  Denies any use of artificial sugars or sweeteners which she has stopped since her last visit.  No other complaints.    Current Outpatient Medications  Medication Sig Dispense Refill   aspirin 81 MG chewable tablet Chew by mouth.     atorvastatin (LIPITOR) 10 MG tablet Take 1 tablet (10 mg total) by mouth daily. 90 tablet 1   estradiol (ESTRACE) 0.1 MG/GM vaginal cream Place 1 Applicatorful vaginally at bedtime. Insert 1g nightly for 1 wk, then 1 g once weekly as maintenace 42.5 g 1   Garlic Oil 500 MG CAPS Take 1 capsule by mouth daily.     hydrochlorothiazide (HYDRODIURIL) 25 MG tablet Take 1 tablet (25 mg total) by mouth daily. 90 tablet 1   lisinopril  (ZESTRIL) 20 MG tablet Take 1 tablet (20 mg total) by mouth daily. 90 tablet 1   metoprolol succinate (TOPROL-XL) 25 MG 24 hr tablet Take 1 tablet (25 mg total) by mouth daily. 90 tablet 1   omeprazole (PRILOSEC) 40 MG capsule TAKE ONE CAPSULE BY MOUTH ONCE DAILY 90 capsule 3   polyethylene glycol (MIRALAX / GLYCOLAX) packet Take 17 g by mouth daily.     vitamin B-12 (CYANOCOBALAMIN) 1000 MCG tablet Take 1 tablet by mouth daily.     No current facility-administered medications for this visit.    Allergies as of 03/24/2021 - Review Complete 03/24/2021  Allergen Reaction Noted   Amlodipine  10/14/2015   Tetracycline  10/14/2015    ROS:  General: Negative for anorexia, weight loss, fever, chills, fatigue, weakness. ENT: Negative for hoarseness, difficulty swallowing , nasal congestion. CV: Negative for chest pain, angina, palpitations, dyspnea on exertion, peripheral edema.  Respiratory: Negative for dyspnea at rest, dyspnea on exertion, cough, sputum, wheezing.  GI: See history of present illness. GU:  Negative for dysuria, hematuria, urinary incontinence, urinary frequency, nocturnal urination.  Endo: Negative for unusual weight change.    Physical Examination:   BP (!) 185/81   Pulse 69   Temp 98.2 F (36.8 C) (Oral)   Ht 5\' 5"  (1.651 m)   Wt 131 lb (59.4 kg)  BMI 21.80 kg/m   General: Well-nourished, well-developed in no acute distress.  Eyes: No icterus. Conjunctivae pink. Abdomen: Bowel sounds are normal, nontender, nondistended, no hepatosplenomegaly or masses, no abdominal bruits or hernia , no rebound or guarding.   Extremities: No lower extremity edema. No clubbing or deformities. Neuro: Alert and oriented x 3.  Grossly intact. Skin: Warm and dry, no jaundice.   Psych: Alert and cooperative, normal mood and affect.   Imaging Studies: No results found.  Assessment and Plan:   Kristin Macias is a 65 y.o. y/o female here to follow up for abdominal bloating and  distention.  Possibly due to bacterial overgrowth syndrome versus IBS constipation.  Plan 1.  Course of Linzess 145 mcg 2-week sample provided I have advised her if it works anemia call and we will give her a prescription 2.  Low FODMAP diet 3.  Increase activated charcoal to twice a day.  She has been concerned about the stool color which I explained to her is just the effects of the charcoal but not to get worried about it. 4.  If no better at next visit we will consider course of antibiotics for SIBO    Dr Wyline Mood  MD,MRCP [facility]) Follow up in as needed

## 2021-04-10 ENCOUNTER — Encounter: Payer: Self-pay | Admitting: Physician Assistant

## 2022-01-14 ENCOUNTER — Other Ambulatory Visit: Payer: Self-pay

## 2022-01-20 ENCOUNTER — Ambulatory Visit: Payer: Medicare HMO | Admitting: Gastroenterology

## 2022-01-20 ENCOUNTER — Encounter: Payer: Self-pay | Admitting: Gastroenterology

## 2022-01-20 VITALS — BP 181/81 | HR 73 | Temp 99.0°F | Wt 133.6 lb

## 2022-01-20 DIAGNOSIS — K5900 Constipation, unspecified: Secondary | ICD-10-CM | POA: Diagnosis not present

## 2022-01-20 DIAGNOSIS — R140 Abdominal distension (gaseous): Secondary | ICD-10-CM

## 2022-01-20 NOTE — Patient Instructions (Signed)
Your next colonoscopy is not due until 2024. We will be sending you a letter to remind you to call us and schedule it.  Low-FODMAP Eating Plan  FODMAP stands for fermentable oligosaccharides, disaccharides, monosaccharides, and polyols. These are sugars that are hard for some people to digest. A low-FODMAP eating plan may help some people who have irritable bowel syndrome (IBS) and certain other bowel (intestinal) diseases to manage their symptoms. This meal plan can be complicated to follow. Work with a diet and nutrition specialist (dietitian) to make a low-FODMAP eating plan that is right for you. A dietitian can help make sure that you get enough nutrition from this diet. What are tips for following this plan? Reading food labels Check labels for hidden FODMAPs such as: High-fructose syrup. Honey. Agave. Natural fruit flavors. Onion or garlic powder. Choose low-FODMAP foods that contain 3-4 grams of fiber per serving. Check food labels for serving sizes. Eat only one serving at a time to make sure FODMAP levels stay low. Shopping Shop with a list of foods that are recommended on this diet and make a meal plan. Meal planning Follow a low-FODMAP eating plan for up to 6 weeks, or as told by your health care provider or dietitian. To follow the eating plan: Eliminate high-FODMAP foods from your diet completely. Choose only low-FODMAP foods to eat. You will do this for 2-6 weeks. Gradually reintroduce high-FODMAP foods into your diet one at a time. Most people should wait a few days before introducing the next new high-FODMAP food into their meal plan. Your dietitian can recommend how quickly you may reintroduce foods. Keep a daily record of what and how much you eat and drink. Make note of any symptoms that you have after eating. Review your daily record with a dietitian regularly to identify which foods you can eat and which foods you should avoid. General tips Drink enough fluid each day  to keep your urine pale yellow. Avoid processed foods. These often have added sugar and may be high in FODMAPs. Avoid most dairy products, whole grains, and sweeteners. Work with a dietitian to make sure you get enough fiber in your diet. Avoid high FODMAP foods at meals to manage symptoms. Recommended foods Fruits Bananas, oranges, tangerines, lemons, limes, blueberries, raspberries, strawberries, grapes, cantaloupe, honeydew melon, kiwi, papaya, passion fruit, and pineapple. Limited amounts of dried cranberries, banana chips, and shredded coconut. Vegetables Eggplant, zucchini, cucumber, peppers, green beans, bean sprouts, lettuce, arugula, kale, Swiss chard, spinach, collard greens, bok choy, summer squash, potato, and tomato. Limited amounts of corn, carrot, and sweet potato. Green parts of scallions. Grains Gluten-free grains, such as rice, oats, buckwheat, quinoa, corn, polenta, and millet. Gluten-free pasta, bread, or cereal. Rice noodles. Corn tortillas. Meats and other proteins Unseasoned beef, pork, poultry, or fish. Eggs. Berniece Salines. Tofu (firm) and tempeh. Limited amounts of nuts and seeds, such as almonds, walnuts, [place] nuts, pecans, peanuts, nut butters, pumpkin seeds, chia seeds, and sunflower seeds. Dairy Lactose-free milk, yogurt, and kefir. Lactose-free cottage cheese and ice cream. Non-dairy milks, such as almond, coconut, hemp, and rice milk. Non-dairy yogurt. Limited amounts of goat cheese, brie, mozzarella, parmesan, swiss, and other hard cheeses. Fats and oils Butter-free spreads. Vegetable oils, such as olive, canola, and sunflower oil. Seasoning and other foods Artificial sweeteners with names that do not end in "ol," such as aspartame, saccharine, and stevia. Maple syrup, white table sugar, raw sugar, brown sugar, and molasses. Mayonnaise, soy sauce, and tamari. Fresh basil, coriander, parsley, rosemary,  and thyme. Beverages Water and mineral water. Sugar-sweetened soft  drinks. Small amounts of orange juice or cranberry juice. Black and green tea. Most dry wines. Coffee. The items listed above may not be a complete list of foods and beverages you can eat. Contact a dietitian for more information. Foods to avoid Fruits Fresh, dried, and juiced forms of apple, pear, watermelon, peach, plum, cherries, apricots, blackberries, boysenberries, figs, nectarines, and mango. Avocado. Vegetables Chicory root, artichoke, asparagus, cabbage, snow peas, Brussels sprouts, broccoli, sugar snap peas, mushrooms, celery, and cauliflower. Onions, garlic, leeks, and the white part of scallions. Grains Wheat, including kamut, durum, and semolina. Barley and bulgur. Couscous. Wheat-based cereals. Wheat noodles, bread, crackers, and pastries. Meats and other proteins Fried or fatty meat. Sausage. Cashews and pistachios. Soybeans, baked beans, black beans, chickpeas, kidney beans, fava beans, navy beans, lentils, black-eyed peas, and split peas. Dairy Milk, yogurt, ice cream, and soft cheese. Cream and sour cream. Milk-based sauces. Custard. Buttermilk. Soy milk. Seasoning and other foods Any sugar-free gum or candy. Foods that contain artificial sweeteners such as sorbitol, mannitol, isomalt, or xylitol. Foods that contain honey, high-fructose corn syrup, or agave. Bouillon, vegetable stock, beef stock, and chicken stock. Garlic and onion powder. Condiments made with onion, such as hummus, chutney, pickles, relish, salad dressing, and salsa. Tomato paste. Beverages Chicory-based drinks. Coffee substitutes. Chamomile tea. Fennel tea. Sweet or fortified wines such as port or sherry. Diet soft drinks made with isomalt, mannitol, maltitol, sorbitol, or xylitol. Apple, pear, and mango juice. Juices with high-fructose corn syrup. The items listed above may not be a complete list of foods and beverages you should avoid. Contact a dietitian for more information. Summary FODMAP stands for  fermentable oligosaccharides, disaccharides, monosaccharides, and polyols. These are sugars that are hard for some people to digest. A low-FODMAP eating plan is a short-term diet that helps to ease symptoms of certain bowel diseases. The eating plan usually lasts up to 6 weeks. After that, high-FODMAP foods are reintroduced gradually and one at a time. This can help you find out which foods may be causing symptoms. A low-FODMAP eating plan can be complicated. It is best to work with a dietitian who has experience with this type of plan. This information is not intended to replace advice given to you by your health care provider. Make sure you discuss any questions you have with your health care provider. Document Revised: 12/14/2019 Document Reviewed: 12/14/2019 Elsevier Patient Education  [place].

## 2022-01-20 NOTE — Progress Notes (Unsigned)
Wyline Mood MD, MRCP(U.K) [address]  [address]  [place], [place] [address]  Main: [phone]  Fax: [phone]   Primary Care Physician: Maryella Shivers (Inactive)  Primary Gastroenterologist:  Dr. Wyline Mood   Chief Complaint  Patient presents with   Gastroesophageal Reflux    HPI: LEORIA PEGLOW is a 66 y.o. female  Summary of history :   She is here today to follow up for dyspepsia/abdominal pain which is likely due to a combination of constipation/gas/SIBO .  She was initially seen on 08/18/17 for reflux about a year in duration. On omeprazole 40 mg once a day,zantac  , daily bowel movements. My impression was she was consuming artifical sugars which maybe causing a lot of gas and associated pain    08/25/17- colonoscopy- diverticulosis otherwise normal , EGD -normal . H pylori stool antigen was negative. Gastric biopsies showed mild reactive gastropathy.     Interval history  03/24/2021-01/20/2022  At her last visit I suspected that she had issues with gas and bloating due to possible bacterial overgrowth as well as degree of constipation.  Commenced her on Linzess she states she took it only once but later switched to [facility] and been having regular bowel movements.  She has noticed that when she consumes artificial sugars such as sugar-free wafers she has more bloating and gas and when she has good bowel movements less bloating and gas.      Current Outpatient Medications  Medication Sig Dispense Refill   aspirin 81 MG chewable tablet Chew by mouth.     atorvastatin (LIPITOR) 10 MG tablet Take 1 tablet by mouth daily.     Biotin 1000 MCG tablet Take 1 tablet by mouth daily.     Charcoal Activated 260 MG CAPS Take 1 capsule by mouth daily.     estradiol (ESTRACE) 0.1 MG/GM vaginal cream Place 1 Applicatorful vaginally at bedtime. Insert 1g nightly for 1 wk, then 1 g once weekly as maintenace 42.5 g 1   famotidine (PEPCID) 20 MG tablet Take 1 tablet by  mouth daily.     Garlic Oil 500 MG CAPS Take 1 capsule by mouth daily.     hydrochlorothiazide (HYDRODIURIL) 25 MG tablet Take 1 tablet (25 mg total) by mouth daily. 90 tablet 1   lisinopril (ZESTRIL) 20 MG tablet Take 1 tablet (20 mg total) by mouth daily. 90 tablet 1   metoprolol succinate (TOPROL-XL) 25 MG 24 hr tablet Take 1 tablet (25 mg total) by mouth daily. 90 tablet 1   Milk Thistle 500 MG CAPS Take 1 capsule by mouth daily.     OMEGA-3 FATTY ACIDS PO Take 300 mg by mouth 1 day or 1 dose.     omeprazole (PRILOSEC) 40 MG capsule TAKE ONE CAPSULE BY MOUTH ONCE DAILY 90 capsule 3   polyethylene glycol powder (GLYCOLAX/MIRALAX) 17 GM/SCOOP powder Take 17 g by mouth daily.     vitamin B-12 (CYANOCOBALAMIN) 1000 MCG tablet Take 1 tablet by mouth daily.     No current facility-administered medications for this visit.    Allergies as of 01/20/2022 - Review Complete 01/20/2022  Allergen Reaction Noted   Amlodipine  10/14/2015   Tetracycline  10/14/2015    ROS:  General: Negative for anorexia, weight loss, fever, chills, fatigue, weakness. ENT: Negative for hoarseness, difficulty swallowing , nasal congestion. CV: Negative for chest pain, angina, palpitations, dyspnea on exertion, peripheral edema.  Respiratory: Negative for dyspnea at rest, dyspnea on exertion,  cough, sputum, wheezing.  GI: See history of present illness. GU:  Negative for dysuria, hematuria, urinary incontinence, urinary frequency, nocturnal urination.  Endo: Negative for unusual weight change.    Physical Examination:   BP (!) 181/81   Pulse 73   Temp 99 F (37.2 C) (Oral)   Wt 133 lb 9.6 oz (60.6 kg)   BMI 22.23 kg/m   General: Well-nourished, well-developed in no acute distress.  Eyes: No icterus. Conjunctivae pink. Mouth: Oropharyngeal mucosa moist and pink , no lesions erythema or exudate. Neuro: Alert and oriented x 3.  Grossly intact. Skin: Warm and dry, no jaundice.   Psych: Alert and  cooperative, normal mood and affect.   Imaging Studies: No results found.  Assessment and Plan:   MACKAY PHIFER is a 66 y.o. y/o female here to follow up for abdominal bloating and distention.  Possibly due to bacterial overgrowth syndrome versus IBS constipation.  Plan 1.  Continue daily MiraLAX, activated charcoal tablets as needed. 2.  We will provided information about a low FODMAP diet to try avoid artificial sugars and sweeteners. 3.  If symptoms worsen may need a course of antibiotics for SIBO which I will try to avoid unless absolutely needed     Dr Jonathon Bellows  MD,MRCP [facility]) Follow up in as needed
